# Patient Record
Sex: Female | Born: 1975
Health system: Southern US, Community
[De-identification: ages and names within clinical notes are randomized; demographics above are authoritative.]

## PROBLEM LIST (undated history)

## (undated) DIAGNOSIS — I10 Essential (primary) hypertension: Secondary | ICD-10-CM

## (undated) DIAGNOSIS — S32000A Wedge compression fracture of unspecified lumbar vertebra, initial encounter for closed fracture: Secondary | ICD-10-CM

## (undated) HISTORY — PX: BACK SURGERY: SHX140

---

## 1999-04-10 ENCOUNTER — Ambulatory Visit (HOSPITAL_COMMUNITY): Admission: RE | Admit: 1999-04-10 | Discharge: 1999-04-10 | Payer: Self-pay | Admitting: Obstetrics and Gynecology

## 1999-04-10 ENCOUNTER — Encounter: Payer: Self-pay | Admitting: Obstetrics and Gynecology

## 1999-04-27 ENCOUNTER — Encounter: Payer: Self-pay | Admitting: Obstetrics and Gynecology

## 1999-04-27 ENCOUNTER — Ambulatory Visit (HOSPITAL_COMMUNITY): Admission: RE | Admit: 1999-04-27 | Discharge: 1999-04-27 | Payer: Self-pay | Admitting: Obstetrics and Gynecology

## 1999-09-08 ENCOUNTER — Encounter (HOSPITAL_COMMUNITY): Admission: RE | Admit: 1999-09-08 | Discharge: 1999-09-22 | Payer: Self-pay | Admitting: Obstetrics and Gynecology

## 1999-09-21 ENCOUNTER — Inpatient Hospital Stay (HOSPITAL_COMMUNITY): Admission: AD | Admit: 1999-09-21 | Discharge: 1999-09-23 | Payer: Self-pay | Admitting: Obstetrics and Gynecology

## 2001-05-31 ENCOUNTER — Other Ambulatory Visit: Admission: RE | Admit: 2001-05-31 | Discharge: 2001-05-31 | Payer: Self-pay | Admitting: Obstetrics and Gynecology

## 2001-07-15 ENCOUNTER — Encounter: Payer: Self-pay | Admitting: Family Medicine

## 2001-07-15 ENCOUNTER — Ambulatory Visit (HOSPITAL_COMMUNITY): Admission: RE | Admit: 2001-07-15 | Discharge: 2001-07-15 | Payer: Self-pay | Admitting: Family Medicine

## 2002-06-18 ENCOUNTER — Other Ambulatory Visit: Admission: RE | Admit: 2002-06-18 | Discharge: 2002-06-18 | Payer: Self-pay | Admitting: Obstetrics and Gynecology

## 2003-07-01 ENCOUNTER — Emergency Department (HOSPITAL_COMMUNITY): Admission: EM | Admit: 2003-07-01 | Discharge: 2003-07-01 | Payer: Self-pay | Admitting: Emergency Medicine

## 2003-09-09 ENCOUNTER — Other Ambulatory Visit: Admission: RE | Admit: 2003-09-09 | Discharge: 2003-09-09 | Payer: Self-pay | Admitting: Obstetrics and Gynecology

## 2003-10-02 ENCOUNTER — Emergency Department (HOSPITAL_COMMUNITY): Admission: EM | Admit: 2003-10-02 | Discharge: 2003-10-02 | Payer: Self-pay | Admitting: Emergency Medicine

## 2005-06-22 ENCOUNTER — Other Ambulatory Visit: Admission: RE | Admit: 2005-06-22 | Discharge: 2005-06-22 | Payer: Self-pay | Admitting: Obstetrics and Gynecology

## 2006-09-02 ENCOUNTER — Ambulatory Visit: Payer: Self-pay | Admitting: Internal Medicine

## 2006-10-12 ENCOUNTER — Ambulatory Visit: Payer: Self-pay | Admitting: Internal Medicine

## 2007-10-27 ENCOUNTER — Ambulatory Visit: Payer: Self-pay | Admitting: Internal Medicine

## 2007-10-27 DIAGNOSIS — H6690 Otitis media, unspecified, unspecified ear: Secondary | ICD-10-CM | POA: Insufficient documentation

## 2007-10-27 DIAGNOSIS — I10 Essential (primary) hypertension: Secondary | ICD-10-CM

## 2008-05-31 ENCOUNTER — Telehealth: Payer: Self-pay | Admitting: Internal Medicine

## 2009-05-29 ENCOUNTER — Encounter: Payer: Self-pay | Admitting: Internal Medicine

## 2009-06-02 ENCOUNTER — Encounter: Admission: RE | Admit: 2009-06-02 | Discharge: 2009-06-02 | Payer: Self-pay | Admitting: Neurological Surgery

## 2009-08-01 ENCOUNTER — Encounter: Payer: Self-pay | Admitting: Internal Medicine

## 2010-03-02 ENCOUNTER — Ambulatory Visit: Payer: Self-pay | Admitting: Internal Medicine

## 2010-03-26 ENCOUNTER — Telehealth: Payer: Self-pay | Admitting: Internal Medicine

## 2010-06-21 LAB — GC/CHLAMYDIA PROBE AMP, GENITAL
Chlamydia: NEGATIVE
Gonorrhea: NEGATIVE

## 2010-06-21 LAB — HIV ANTIBODY (ROUTINE TESTING W REFLEX): HIV: NONREACTIVE

## 2010-06-21 LAB — TYPE AND SCREEN: Antibody Screen: NEGATIVE

## 2010-06-21 LAB — RPR: RPR: NONREACTIVE

## 2010-06-21 LAB — ABO/RH: RH Type: POSITIVE

## 2010-08-02 ENCOUNTER — Encounter: Payer: Self-pay | Admitting: Neurological Surgery

## 2010-08-13 NOTE — Letter (Signed)
Summary: [facility]  [facility]   Imported By: Maryln Gottron 08/12/2009 11:13:03  _____________________________________________________________________  External Attachment:    Type:   Image     Comment:   External Document

## 2010-08-13 NOTE — Assessment & Plan Note (Signed)
Summary: fu on meds/njr   Vital Signs:  Patient profile:   35 year old female Weight:      147 pounds Temp:     99.1 degrees F oral BP sitting:   150 / 90  (left arm) Cuff size:   regular  Vitals Entered By: Kathrynn Speed CMA (March 02, 2010 1:12 PM) CC: fu on med, has not taken Ramipril for 2 days,src   CC:  fu on med, has not taken Ramipril for 2 days, and src.  History of Present Illness: 35 year old patient who is seen today for follow-up of her hypertension.  Her medication was refilled her in a month as she has not taken this for two days.  She feels well.  No new concerns or complaints.  She has been on Altace 5 mg daily.  She states that when she tracks her blood pressure readings that usually in the 140/90 range.  She has tolerated her medications well and denies any coughing.  Compliance issues were addressed.  She states at the present time, he is attempting to become pregnant  Current Medications (verified): 1)  Ramipril 5 Mg  Caps (Ramipril) .Marland Kitchen.. 1 Once Daily  Allergies (verified): No Known Drug Allergies  Past History:  Past Surgical History: lamenectomy 11-2009  Review of Systems  The patient denies anorexia, fever, weight loss, weight gain, vision loss, decreased hearing, hoarseness, chest pain, syncope, dyspnea on exertion, peripheral edema, prolonged cough, headaches, hemoptysis, abdominal pain, melena, hematochezia, severe indigestion/heartburn, hematuria, incontinence, genital sores, muscle weakness, suspicious skin lesions, transient blindness, difficulty walking, depression, unusual weight change, abnormal bleeding, enlarged lymph nodes, angioedema, and breast masses.    Physical Exam  General:  overweight-appearing.  150/94 Head:  Normocephalic and atraumatic without obvious abnormalities. No apparent alopecia or balding. Eyes:  No corneal or conjunctival inflammation noted. EOMI. Perrla. Funduscopic exam benign, without hemorrhages, exudates or  papilledema. Vision grossly normal. Mouth:  Oral mucosa and oropharynx without lesions or exudates.  Teeth in good repair. Neck:  No deformities, masses, or tenderness noted. Lungs:  Normal respiratory effort, chest expands symmetrically. Lungs are clear to auscultation, no crackles or wheezes. Heart:  Normal rate and regular rhythm. S1 and S2 normal without gallop, murmur, click, rub or other extra sounds. Abdomen:  Bowel sounds positive,abdomen soft and non-tender without masses, organomegaly or hernias noted. Msk:  No deformity or scoliosis noted of thoracic or lumbar spine.     Impression & Recommendations:  Problem # 1:  HYPERTENSION (ICD-401.9)  The following medications were removed from the medication list:    Ramipril 5 Mg Caps (Ramipril) .Marland Kitchen... 1 once daily Her updated medication list for this problem includes:    Bisoprolol-hydrochlorothiazide 5-6.25 Mg Tabs (Bisoprolol-hydrochlorothiazide) ..... One daily  The following medications were removed from the medication list:    Ramipril 5 Mg Caps (Ramipril) .Marland Kitchen... 1 once daily Her updated medication list for this problem includes:    Lisinopril-hydrochlorothiazide 20-12.5 Mg Tabs (Lisinopril-hydrochlorothiazide) ..... One daily  Complete Medication List: 1)  Bisoprolol-hydrochlorothiazide 5-6.25 Mg Tabs (Bisoprolol-hydrochlorothiazide) .... One daily  Patient Instructions: 1)  discontinue ramipril  2)  Limit your Sodium (Salt). 3)  It is important that you exercise regularly at least 20 minutes 5 times a week. If you develop chest pain, have severe difficulty breathing, or feel very tired , stop exercising immediately and seek medical attention. 4)  Check your Blood Pressure regularly. If it is above: 140/90 you should make an appointment. 5)  Please schedule a follow-up appointment  in 3 months. Prescriptions: BISOPROLOL-HYDROCHLOROTHIAZIDE 5-6.25 MG TABS (BISOPROLOL-HYDROCHLOROTHIAZIDE) one daily  #90 x 3   Entered and  Authorized by:   Gordy Savers  MD   Signed by:   Gordy Savers  MD on 03/02/2010   Method used:   Print then Give to Patient   RxID:   [id number]

## 2010-08-13 NOTE — Letter (Signed)
Summary: [facility]  [facility]   Imported By: Maryln Gottron 07/23/2009 10:53:38  _____________________________________________________________________  External Attachment:    Type:   Image     Comment:   External Document

## 2010-08-13 NOTE — Progress Notes (Signed)
Summary: change meds  Phone Note Call from Patient Call back at Home Phone [phone]   Caller: Patient Call For: Gordy Savers  MD Summary of Call: Pt is stating the Bisoprolol/hctz is making her chest feel tight and gives her irregular heart rate.  Wants to change to something else, please. Initial call taken by: Lynann Beaver CMA,  March 26, 2010 8:51 AM  Follow-up for Phone Call        atenolol 50 mg  #90 one daily Follow-up by: Gordy Savers  MD,  March 26, 2010 9:09 AM    New/Updated Medications: ATENOLOL 50 MG TABS (ATENOLOL) one by mouth daily Prescriptions: ATENOLOL 50 MG TABS (ATENOLOL) one by mouth daily  #90 x 0   Entered by:   Lynann Beaver CMA   Authorized by:   Gordy Savers  MD   Signed by:   Lynann Beaver CMA on 03/26/2010   Method used:   Electronically to        [facility]* (retail)       [address]       [place], [place]  [id number]       Ph: [phone]       Fax: [phone]   RxID:   [id number]  Pt notified.

## 2010-11-27 NOTE — Discharge Summary (Signed)
[facility]  Patient:    [name], [name]                    MRN: [id number] Adm. Date:  [id number] Disc. Date: [id number] Attending:  Oliver Pila                           Discharge Summary  DISCHARGE DIAGNOSES:          1. Postdates pregnancy at 42 weeks, delivered.                               2. Status post induction and normal spontaneous                                  vaginal delivery.  DISCHARGE MEDICATIONS:        1. Motrin 600 mg p.o. every six hours.                               2. Percocet one to two tablets p.o. every four to  six hours p.r.n.  FOLLOW-UP:                    The patient is to follow up in approximately six weeks for her routine postpartum visit.  HOSPITAL COURSE:              The patient is a 35 year old, gravida 1, para 0, ho is admitted at 2 weeks as determined by her LMP consistent with an 18-week sono for induction given postdates pregnancy.  Pregnancy had been uncomplicated except for remote history of HSV.  She had had no outbreaks in three years and she had had NSTs biweekly since 40 weeks for her postdates status.  On admission, she had no leakage of fluid, no vaginal bleeding, and good fetal movement.  PRENATAL LABORATORY DATA:     A positive, antibody negative, RPR nonreactive, rubella immune, hepatitis B surface antigen negative, HIV negative, GC negative, Chlamydia negative, and GBS negative.  PAST OBSTETRIC HISTORY:       Insignificant.  GYN HISTORY:                  She had a history of cryotherapy for an abnormal ap smear and history of HSV as stated.  No medical history.  No surgical history of.  ALLERGIES:                    No known drug allergies.  PHYSICAL EXAMINATION:         VITAL SIGNS: She was afebrile with stable vital signs.  Fetal heart rate was reactive with a baseline of 115 to 120.  PELVIC: Her cervix was 50% effaced, tight 1 cm, and -2 station.  She had assisted  rupture of membranes with clear fluid obtained and was begun on Pitocin augmentation.  The  patient progressed throughout the day and had a normal spontaneous vaginal delivery of a vigorous female infant over a first degree perineal laceration.  Apgars were 8 and 9.  Weight was 7 pounds 10 ounces.  The placenta delivered spontaneously and her first degree laceration was repaired with 2-0 Vicryl.  Estimated blood loss was 300 cc.  She had a mild postpartum  temperature to 100.4, however, this resolved  quickly after delivery and on postpartum day #1, she was doing well, afebrile, ith stable vital signs.  Her fundus was firm and nontender.  Therefore, she was discharged to home with follow-up as previously stated. DD:  09/23/99 TD:  09/23/99 Job: [id number]

## 2010-11-27 NOTE — Assessment & Plan Note (Signed)
[facility]                            [facility] OFFICE NOTE   NAME:[name], [name]                    MRN:          [id number]  DATE:09/02/2006                            DOB:          11/20/1975    A 35 year old female seen today to establish with our practice.  She has  noted to have high blood pressure per GYN.  She is on ramipril 2.5 mg  daily.   PAST MEDICAL HISTORY:  Unremarkable.  She is a gravida 1, para 1,  abortus 0.  She has recently been prescribed Chantix for smoking  cessation.   FAMILY HISTORY:  Largely unremarkable except for a strong family history  of hypertension.  Mother died of lung cancer.   EXAMINATION:  GENERAL:  Revealed a well-developed young lady in no acute  distress.  VITAL SIGNS:  Blood pressure was consistently 160/100.  HEENT:  Fundi, ear, nose and throat clear.  NECK:  No adenopathy or bruits.  CHEST:  Clear.  CARDIOVASCULAR:  Normal heart sounds, no murmurs.  ABDOMEN:  Benign.  No bruits appreciated.  EXTREMITIES:  Negative.   IMPRESSION:  Moderately severe hypertension.   DISPOSITION:  Hydrochlorothiazide 25 mg daily will be added to her  regimen.  Her ramipril will be increased to 5 mg daily.  She will return  in 1 month for followup.     Gordy Savers, MD  Electronically Signed    PFK/MedQ  DD: 09/02/2006  DT: 09/02/2006  Job #: [id number]

## 2011-01-27 ENCOUNTER — Encounter (HOSPITAL_COMMUNITY): Payer: Self-pay | Admitting: *Deleted

## 2011-01-27 ENCOUNTER — Inpatient Hospital Stay (HOSPITAL_COMMUNITY)
Admission: AD | Admit: 2011-01-27 | Discharge: 2011-01-29 | DRG: 372 | Disposition: A | Discharge status: Home / Self Care | Payer: BC Managed Care – PPO | Source: Ambulatory Visit | Attending: Obstetrics and Gynecology | Admitting: Obstetrics and Gynecology

## 2011-01-27 ENCOUNTER — Inpatient Hospital Stay (HOSPITAL_COMMUNITY): Admission: RE | Admit: 2011-01-27 | Payer: Self-pay | Source: Ambulatory Visit

## 2011-01-27 DIAGNOSIS — O4290 Premature rupture of membranes, unspecified as to length of time between rupture and onset of labor, unspecified weeks of gestation: Principal | ICD-10-CM | POA: Diagnosis present

## 2011-01-27 DIAGNOSIS — O1002 Pre-existing essential hypertension complicating childbirth: Secondary | ICD-10-CM | POA: Diagnosis present

## 2011-01-27 DIAGNOSIS — I10 Essential (primary) hypertension: Secondary | ICD-10-CM

## 2011-01-27 DIAGNOSIS — H6690 Otitis media, unspecified, unspecified ear: Secondary | ICD-10-CM

## 2011-01-27 HISTORY — DX: Essential (primary) hypertension: I10

## 2011-01-27 MED ORDER — LACTATED RINGERS IV SOLN
INTRAVENOUS | Status: DC
  Administered 2011-01-27: 21:00:00 via INTRAVENOUS

## 2011-01-27 MED ORDER — LACTATED RINGERS IV SOLN: 500.0000 mL | INTRAVENOUS | Status: DC | PRN

## 2011-01-27 MED ORDER — OXYTOCIN 20 UNITS IN LACTATED RINGERS INFUSION - SIMPLE
1.0000 m[IU]/min | INTRAVENOUS | Status: DC
  Administered 2011-01-27: 1 m[IU]/min via INTRAVENOUS
  Administered 2011-01-27: 5 m[IU]/min via INTRAVENOUS
  Filled 2011-01-27: qty 1000

## 2011-01-27 MED ORDER — CLINDAMYCIN PHOSPHATE 900 MG/50ML IV SOLN: 900.0000 mg | Freq: Three times a day (TID) | INTRAVENOUS | Status: DC

## 2011-01-27 MED ORDER — OXYCODONE-ACETAMINOPHEN 5-325 MG PO TABS: 2.0000 | ORAL_TABLET | ORAL | Status: DC | PRN

## 2011-01-27 MED ORDER — IBUPROFEN 600 MG PO TABS: 600.0000 mg | ORAL_TABLET | Freq: Four times a day (QID) | ORAL | Status: DC | PRN

## 2011-01-27 MED ORDER — OXYTOCIN 20 UNITS IN LACTATED RINGERS INFUSION - SIMPLE: 125.0000 mL/h | Freq: Once | INTRAVENOUS | Status: DC

## 2011-01-27 MED ORDER — PENICILLIN G POTASSIUM 5000000 UNITS IJ SOLR: 2.5000 10*6.[IU] | INTRAVENOUS | Status: DC

## 2011-01-27 MED ORDER — PENICILLIN G POTASSIUM 5000000 UNITS IJ SOLR: 5.0000 10*6.[IU] | Freq: Once | INTRAVENOUS | Status: DC

## 2011-01-27 MED ORDER — LIDOCAINE HCL (PF) 1 % IJ SOLN: 30.0000 mL | INTRAMUSCULAR | Status: DC | PRN

## 2011-01-27 MED ORDER — LIDOCAINE HCL (PF) 1 % IJ SOLN
30.0000 mL | INTRAMUSCULAR | Status: DC | PRN
  Filled 2011-01-27: qty 30

## 2011-01-27 MED ORDER — LACTATED RINGERS IV SOLN: INTRAVENOUS | Status: DC

## 2011-01-27 NOTE — Plan of Care (Signed)
Problem: Consults Goal: Birthing Suites Patient Information Press F2 to bring up selections list Outcome: Completed/Met Date Met:  01/27/11  Pt > [redacted] weeks EGA

## 2011-01-28 ENCOUNTER — Inpatient Hospital Stay (HOSPITAL_COMMUNITY): Payer: BC Managed Care – PPO | Admitting: Anesthesiology

## 2011-01-28 ENCOUNTER — Encounter (HOSPITAL_COMMUNITY): Payer: Self-pay | Admitting: Unknown Physician Specialty

## 2011-01-28 ENCOUNTER — Encounter (HOSPITAL_COMMUNITY): Payer: Self-pay | Admitting: Anesthesiology

## 2011-01-28 ENCOUNTER — Encounter (HOSPITAL_COMMUNITY): Payer: Self-pay

## 2011-01-28 ENCOUNTER — Other Ambulatory Visit: Payer: Self-pay | Admitting: Obstetrics and Gynecology

## 2011-01-28 MED ORDER — EPHEDRINE 5 MG/ML INJ: 10.0000 mg | INTRAVENOUS | Status: DC | PRN

## 2011-01-28 MED ORDER — SENNOSIDES-DOCUSATE SODIUM 8.6-50 MG PO TABS: 1.0000 | ORAL_TABLET | Freq: Every day | ORAL | Status: DC

## 2011-01-28 MED ORDER — SIMETHICONE 80 MG PO CHEW: 80.0000 mg | CHEWABLE_TABLET | ORAL | Status: DC | PRN

## 2011-01-28 MED ORDER — TETANUS-DIPHTH-ACELL PERTUSSIS 5-2.5-18.5 LF-MCG/0.5 IM SUSP
0.5000 mL | Freq: Once | INTRAMUSCULAR | Status: DC
  Filled 2011-01-28: qty 0.5

## 2011-01-28 MED ORDER — DIPHENHYDRAMINE HCL 25 MG PO CAPS: 25.0000 mg | ORAL_CAPSULE | Freq: Four times a day (QID) | ORAL | Status: DC | PRN

## 2011-01-28 MED ORDER — LACTATED RINGERS IV SOLN
500.0000 mL | Freq: Once | INTRAVENOUS | Status: AC
  Administered 2011-01-28: 1000 mL via INTRAVENOUS

## 2011-01-28 MED ORDER — ONDANSETRON HCL 4 MG/2ML IJ SOLN: 4.0000 mg | INTRAMUSCULAR | Status: DC | PRN

## 2011-01-28 MED ORDER — DIPHENHYDRAMINE HCL 50 MG/ML IJ SOLN: 12.5000 mg | INTRAMUSCULAR | Status: DC | PRN

## 2011-01-28 MED ORDER — MEASLES, MUMPS & RUBELLA VAC ~~LOC~~ INJ
0.5000 mL | INJECTION | Freq: Once | SUBCUTANEOUS | Status: DC
  Filled 2011-01-28: qty 0.5

## 2011-01-28 MED ORDER — EPHEDRINE 5 MG/ML INJ
INTRAVENOUS | Status: AC
  Filled 2011-01-28: qty 4

## 2011-01-28 MED ORDER — WITCH HAZEL-GLYCERIN EX PADS: MEDICATED_PAD | CUTANEOUS | Status: DC | PRN

## 2011-01-28 MED ORDER — FENTANYL 2.5 MCG/ML BUPIVACAINE 1/10 % EPIDURAL INFUSION (WH - ANES)
INTRAMUSCULAR | Status: AC
  Administered 2011-01-28: 14 mL/h via EPIDURAL
  Filled 2011-01-28: qty 60

## 2011-01-28 MED ORDER — OXYCODONE-ACETAMINOPHEN 5-325 MG PO TABS
1.0000 | ORAL_TABLET | ORAL | Status: DC | PRN
  Administered 2011-01-28: 2 via ORAL
  Administered 2011-01-28: 1 via ORAL
  Administered 2011-01-28 – 2011-01-29 (×3): 2 via ORAL
  Filled 2011-01-28 (×5): qty 2

## 2011-01-28 MED ORDER — LIDOCAINE HCL 1.5 % IJ SOLN
INTRAMUSCULAR | Status: DC | PRN
  Administered 2011-01-28 (×2): 4 mL

## 2011-01-28 MED ORDER — PHENYLEPHRINE 40 MCG/ML (10ML) SYRINGE FOR IV PUSH (FOR BLOOD PRESSURE SUPPORT): 80.0000 ug | PREFILLED_SYRINGE | INTRAVENOUS | Status: DC | PRN

## 2011-01-28 MED ORDER — NALBUPHINE SYRINGE 5 MG/0.5 ML
INJECTION | INTRAMUSCULAR | Status: AC
  Administered 2011-01-28: 5 mg via INTRAVENOUS
  Filled 2011-01-28: qty 0.5

## 2011-01-28 MED ORDER — NALBUPHINE HCL 10 MG/ML IJ SOLN: 5.0000 mg | Freq: Four times a day (QID) | INTRAMUSCULAR | Status: DC | PRN

## 2011-01-28 MED ORDER — ONDANSETRON HCL 4 MG PO TABS: 4.0000 mg | ORAL_TABLET | ORAL | Status: DC | PRN

## 2011-01-28 MED ORDER — OXYCODONE-ACETAMINOPHEN 5-325 MG PO TABS: 1.0000 | ORAL_TABLET | ORAL | Status: DC | PRN

## 2011-01-28 MED ORDER — BENZOCAINE-MENTHOL 20-0.5 % EX AERO: 1.0000 "application " | INHALATION_SPRAY | CUTANEOUS | Status: DC | PRN

## 2011-01-28 MED ORDER — PRENATAL PLUS 27-1 MG PO TABS
1.0000 | ORAL_TABLET | Freq: Every day | ORAL | Status: DC
  Administered 2011-01-28 – 2011-01-29 (×2): 1 via ORAL
  Filled 2011-01-28 (×2): qty 1

## 2011-01-28 MED ORDER — ZOLPIDEM TARTRATE 5 MG PO TABS: 5.0000 mg | ORAL_TABLET | Freq: Every evening | ORAL | Status: DC | PRN

## 2011-01-28 MED ORDER — IBUPROFEN 600 MG PO TABS
600.0000 mg | ORAL_TABLET | Freq: Four times a day (QID) | ORAL | Status: DC
  Administered 2011-01-28 – 2011-01-29 (×4): 600 mg via ORAL
  Filled 2011-01-28 (×4): qty 1

## 2011-01-28 MED ORDER — OXYTOCIN 20 UNITS IN LACTATED RINGERS INFUSION - SIMPLE: 125.0000 mL/h | INTRAVENOUS | Status: AC

## 2011-01-28 MED ORDER — BENZOCAINE-MENTHOL 20-0.5 % EX AERO
INHALATION_SPRAY | CUTANEOUS | Status: AC
  Filled 2011-01-28: qty 56

## 2011-01-28 MED ORDER — LANOLIN HYDROUS EX OINT: TOPICAL_OINTMENT | CUTANEOUS | Status: DC | PRN

## 2011-01-28 MED ORDER — FENTANYL 2.5 MCG/ML BUPIVACAINE 1/10 % EPIDURAL INFUSION (WH - ANES)
14.0000 mL/h | INTRAMUSCULAR | Status: DC
  Administered 2011-01-28: 14 mL/h via EPIDURAL
  Filled 2011-01-28: qty 60

## 2011-01-28 MED ORDER — PHENYLEPHRINE 40 MCG/ML (10ML) SYRINGE FOR IV PUSH (FOR BLOOD PRESSURE SUPPORT)
PREFILLED_SYRINGE | INTRAVENOUS | Status: AC
  Filled 2011-01-28: qty 5

## 2011-01-28 MED ORDER — IBUPROFEN 600 MG PO TABS: 600.0000 mg | ORAL_TABLET | Freq: Four times a day (QID) | ORAL | Status: DC

## 2011-01-28 MED ORDER — PRENATAL PLUS 27-1 MG PO TABS
1.0000 | ORAL_TABLET | Freq: Every day | ORAL | Status: DC
  Administered 2011-01-28: 1 via ORAL

## 2011-01-28 NOTE — Anesthesia Procedure Notes (Signed)
Epidural Patient location during procedure: OB Start time: 01/28/2011 2:21 AM  Staffing Anesthesiologist: Candie Gintz A. Performed by: anesthesiologist   Preanesthetic Checklist Completed: patient identified, site marked, surgical consent, pre-op evaluation, timeout performed, IV checked, risks and benefits discussed and monitors and equipment checked  Epidural Patient position: sitting Prep: site prepped and draped and DuraPrep Patient monitoring: continuous pulse ox and blood pressure Approach: midline Injection technique: LOR air  Needle:  Needle type: Tuohy  Needle gauge: 17 G Needle length: 9 cm Needle insertion depth: 5 cm Catheter type: closed end flexible Catheter size: 19 Gauge Catheter at skin depth: 10 cm Test dose: negative and 1.5% lidocaine  Assessment Sensory level: T8 Events: blood not aspirated, injection not painful, no injection resistance, negative IV test and no paresthesia  Additional Notes Patient is more comfortable after epidural dosed. Please see RN's note for documentation of vital signs and FHR which are stable.

## 2011-01-28 NOTE — Progress Notes (Signed)
  Day of delivery. Afebrile no complaints

## 2011-01-28 NOTE — Progress Notes (Signed)
  Pt received her epidural and about the same time noted a gush of fluid from her vagina that came as a result  of her rupturing a forebag that the nurse had noted on her prior exam. Subsequently, the FHR began to  exhibit bradycardia that was treated with O2, position change and stopping the pitocin. I tried to insert a scalp electrode but the cervix was posterior and the station was high and I was unsuccessful. However, after my exam ,the cervix seemed to have become more anterior and was 4 cm. Now, the pitocin has been started back at 1/2 the prior dose and her contractions are q 5 minutes and her variables are down to 70's and 80's but there is good recovery at this time. I have alerted the OR that an emergency might occur.

## 2011-01-28 NOTE — Progress Notes (Signed)
  Delivery note:   She progressed rapidly to full dilatation, pushed well and delivered spontaneously ROA over a first degree perineal laceration a living female infant 6lbs. 9 oz with Apgars of 9at 1and 10 at 5 minutes. The DeLee and the bulb were used with the head on the perineum. The placenta was slow to separate but it delivered intact.The uterus was normal. There were 2 labial and a first degree perineal laceration that were hemostatic and not repaired. EBL 400 cc's.

## 2011-01-28 NOTE — H&P (Signed)
[name], [name] NO.:  [id number]  MEDICAL RECORD NO.:  [id number]  LOCATION:                                 FACILITY:  PHYSICIAN:  Malachi Pro. Ambrose Mantle, M.D. DATE OF BIRTH:  [birth year]  DATE OF ADMISSION:  01/27/2011 DATE OF DISCHARGE:                             HISTORY & PHYSICAL   HISTORY OF PRESENT ILLNESS:  This is a 35 year old white female, para 1- 0-0-1, gravida 2, EDC January 25, 2011, admitted with premature rupture of the membranes.  The patient states her membranes ruptured with a slight yellowish hue to the fluid at 6:30 a.m. on January 27, 2011.  She did not call our office in the late in the afternoon.  She was seen in the office, rupture of membranes was confirmed and she was sent to MAU for admission to the hospital.  She was kept in MAU for two and a half hours or so before a bed was available.  She is admitted now to begin on Pitocin for induction of labor.  She has had a few contractions, but nothing of any significance.  Blood group and type A+.  Negative antibody.  Pap smear normal.  Rubella immune.  RPR nonreactive. Hepatitis B surface antigen negative.  HIV negative.  GC and Chlamydia negative.  Cystic fibrosis screening negative.  First trimester screening negative.  Down syndrome risk 1 in 2900.  Trisomy 18 less than 1 in 10,000.  AFP was negative.  Ultrasound on June 22, 2010 showed an viable intrauterine pregnancy of 9 weeks and 1 day with an [facility] of January 24, 2011.  Another ultrasound on August 31, 2010 showed normal anatomy compatible with dates.  Her prenatal course was complicated by hypertension for which she took labetalol off and on and her last dosage was 25 mg a day every third or fourth day, by smoking and by chronic back pain for which she took Vicodin 10/325, probably 1 or 2 tablets per day.  The patient's group B strep culture was negative.  One-hour Glucola was 123.  Ultrasound on January 05, 2011 showed normal growth  of the fetus with growth in the 60th percentile, normal fluid volume.  Her blood pressures remains somewhat erratic, sometimes as high as 150/100 as low as 70/40.  PAST MEDICAL HISTORY:  Reveals no known allergies.  ILLNESSES:  Back injury, bulging disc, cervical dysplasia, cryosurgery in 1997, herpes genitalis, and hypertension.  OPERATIONS:  In 2011, she had a disk shaved from her lumbar spine.  MEDICATIONS:  Hydrocodone/acetaminophen 10/325 one or two a day. Labetalol 100 mg most recently one-quarter tablet every 3 or 4 days.  SOCIAL HISTORY:  The patient denies alcohol and illicit substance abuse. She does smoke.  FAMILY HISTORY:  Mother with a malignant brain tumor.  Father with a CVA and hypertension.  Mother with malignant lung cancer and paternal grandfather with myocardial infarction.  OBSTETRICAL HISTORY:  In September 22, 1999, the patient delivered a 7 pounds 10 ounces female vaginally at 42 weeks.  PHYSICAL EXAM:  VITAL SIGNS:  On admission, afebrile.  Blood pressure 139/96, pulse 93, respirations 18. HEART:  Normal size and sounds.  No murmurs. LUNGS:  Clear  to auscultation. ABDOMEN:  Soft.  Fundal height 36 cm.  Fetal heart tones normal.  Cervix with fingertip, 20% vertex at -3, slightly yellow stained fluid is present in the vagina and this is fern positive.  ADMITTING IMPRESSION:  Intrauterine pregnancy at 40+ weeks, premature rupture of the membranes, hypertension, chronic back pain, tobacco abuse.  The patient is admitted for intravenous Pitocin after rupture of membranes for 14-1/2 hours.     Malachi Pro. Ambrose Mantle, M.D.     TFH/MEDQ  D:  01/27/2011  T:  01/28/2011  Job:  [id number]

## 2011-01-28 NOTE — Progress Notes (Signed)
  Pt is on 7 mu/minute of pitocin and is having 10/10 pain. She states her pains have been severe for 1-2 hours. We have requested an epidural. FHT's are normal.

## 2011-01-28 NOTE — Anesthesia Preprocedure Evaluation (Signed)
Anesthesia Evaluation  Name, MR# and DOB Patient awake  General Assessment Comment  Reviewed: Allergy & Precautions, H&P  and Patient's Chart, lab work & pertinent test results  Airway Mallampati: III TM Distance: >3 FB Neck ROM: full    Dental No notable dental hx    Pulmonaryneg pulmonary ROS    clear to auscultation  pulmonary exam normal   Cardiovascular hypertension, On Medications    Neuro/PsychNegative Neurological ROS Negative Psych ROS  GI/Hepatic/Renal negative GI ROS, negative Liver ROS, and negative Renal ROS (+)       Endo/Other  Negative Endocrine ROS (+)   Abdominal   Musculoskeletal  Hematology negative hematology ROS (+)   Peds  Reproductive/Obstetrics (+) Pregnancy   Anesthesia Other Findings             Anesthesia Physical Anesthesia Plan  ASA: II  Anesthesia Plan: Epidural   Post-op Pain Management:    Induction:   Airway Management Planned:   Additional Equipment:   Intra-op Plan:   Post-operative Plan:   Informed Consent: I have reviewed the patients History and Physical, chart, labs and discussed the procedure including the risks, benefits and alternatives for the proposed anesthesia with the patient or authorized representative who has indicated his/her understanding and acceptance.     Plan Discussed with: Anesthesiologist  Anesthesia Plan Comments:         Anesthesia Quick Evaluation

## 2011-01-29 LAB — CBC
HCT: 33.1 % — ABNORMAL LOW (ref 36.0–46.0)
Hemoglobin: 11 g/dL — ABNORMAL LOW (ref 12.0–15.0)
MCHC: 33.2 g/dL (ref 30.0–36.0)
MCV: 91.7 fL (ref 78.0–100.0)
RDW: 14.2 % (ref 11.5–15.5)
WBC: 15.4 10*3/uL — ABNORMAL HIGH (ref 4.0–10.5)

## 2011-01-29 NOTE — Progress Notes (Signed)
#  1 afebrile BP acceptable Wants early discharge.

## 2011-01-29 NOTE — Progress Notes (Signed)
Charted on wrong pt.

## 2011-01-29 NOTE — Discharge Summary (Signed)
[name], [name] NO.:  [id number]  MEDICAL RECORD NO.:  [id number]  LOCATION:  9140                          FACILITY:  [facility]  PHYSICIAN:  Malachi Pro. Ambrose Mantle, M.D. DATE OF BIRTH:  09-16-1975  DATE OF ADMISSION:  01/27/2011 DATE OF DISCHARGE:                              DISCHARGE SUMMARY   HISTORY OF PRESENT ILLNESS:  This is a 35 year old white female para 1-0- 1, gravida 2 with [facility] January 25, 2011, admitted with premature rupture of the membranes.  Her labs showed blood group and type to be A+ with a negative antibody.  Pap smear was normal.  Rubella immune, RPR was nonreactive.  Hepatitis B surface antigen negative, HIV negative, GC and chlamydia negative, cystic fibrosis negative.  First trimester screen was negative.  Down syndrome risk 1 in 2900, trisomy 18 less than 1 and 10,000, AFP was negative.  The patient was admitted to the hospital and progressed in labor.  There were significant decelerations during the labor with necessitating stopping the Pitocin and restarting at a lower dose.  She did receive an epidural at 3:16 a.m. on January 28, 2011.  She noted a gush of fluid from her vagina and subsequently the fetal heart rate began to exhibit bradycardia that was treated with oxygen, position change, and stopping the Pitocin.  Subsequently, after we had resuscitated the baby, the patient did progress to full dilatation, pushed well, and delivered spontaneously ROA over first-degree perineal laceration, a living female infant 6 pounds 9 ounces with Apgars of 9 at one and 10 at five minutes.  Dr. Ambrose Mantle was in attendance.  Bulb were used with the head on the perineum.  There were three small lacerations that were hemostatic and not repaired.  Blood loss about 400 cc.  Preop hemoglobin was normal.  Postoperatively, the hemoglobin was 11, hematocrit 33.1, white count 15,400.  RPR was nonreactive.  FINAL DIAGNOSES:  Intrauterine pregnancy at term, delivered  ROA, premature rupture of the membranes.  OPERATION:  Spontaneous delivery ROA.  FINAL CONDITION:  Improved.  INSTRUCTIONS:  Include a regular discharge instruction booklet.  She will take Motrin over-the-counter and she has a prescription for Vicodin 10/325 that she will take as needed for pain and she has labetalol 100 mg and she takes 0.25% of a tablet on an as-needed basis.  She is to return to the office in 6 weeks for followup examination.     Malachi Pro. Ambrose Mantle, M.D.     TFH/MEDQ  D:  01/29/2011  T:  01/29/2011  Job:  [id number]

## 2011-02-11 ENCOUNTER — Encounter (HOSPITAL_COMMUNITY): Payer: Self-pay | Admitting: *Deleted

## 2011-02-19 ENCOUNTER — Other Ambulatory Visit: Payer: Self-pay | Admitting: *Deleted

## 2011-02-19 DIAGNOSIS — M545 Low back pain: Secondary | ICD-10-CM

## 2011-02-24 ENCOUNTER — Other Ambulatory Visit: Payer: BC Managed Care – PPO

## 2011-02-26 ENCOUNTER — Ambulatory Visit
Admission: RE | Admit: 2011-02-26 | Discharge: 2011-02-26 | Disposition: A | Discharge status: Home / Self Care | Payer: BC Managed Care – PPO | Source: Ambulatory Visit | Attending: *Deleted | Admitting: *Deleted

## 2011-02-26 DIAGNOSIS — M545 Low back pain: Secondary | ICD-10-CM

## 2011-02-26 MED ORDER — GADOBENATE DIMEGLUMINE 529 MG/ML IV SOLN
10.0000 mL | Freq: Once | INTRAVENOUS | Status: AC | PRN
  Administered 2011-02-26: 10 mL via INTRAVENOUS

## 2011-11-24 ENCOUNTER — Ambulatory Visit (INDEPENDENT_AMBULATORY_CARE_PROVIDER_SITE_OTHER): Payer: BC Managed Care – PPO | Admitting: Internal Medicine

## 2011-11-24 ENCOUNTER — Encounter: Payer: Self-pay | Admitting: Internal Medicine

## 2011-11-24 VITALS — BP 130/90 | Temp 99.0°F | Ht 62.5 in | Wt 123.0 lb

## 2011-11-24 DIAGNOSIS — I10 Essential (primary) hypertension: Secondary | ICD-10-CM

## 2011-11-24 MED ORDER — LORAZEPAM 0.5 MG PO TABS: 0.5000 mg | ORAL_TABLET | Freq: Two times a day (BID) | ORAL | Status: AC | PRN

## 2011-11-24 MED ORDER — RAMIPRIL 5 MG PO CAPS: 5.0000 mg | ORAL_CAPSULE | Freq: Every day | ORAL | Status: DC

## 2011-11-24 MED ORDER — ZOLPIDEM TARTRATE 10 MG PO TABS: 10.0000 mg | ORAL_TABLET | Freq: Every evening | ORAL | Status: DC | PRN

## 2011-11-24 NOTE — Patient Instructions (Signed)
Limit your sodium (Salt) intake    It is important that you exercise regularly, at least 20 minutes 3 to 4 times per week.  If you develop chest pain or shortness of breath seek  medical attention.  Behavioral Health referral as discussed

## 2011-11-25 ENCOUNTER — Encounter: Payer: Self-pay | Admitting: Internal Medicine

## 2011-11-25 NOTE — Progress Notes (Signed)
  Subjective:    Patient ID: [name], female    DOB: 1975-10-18, 36 y.o.   MRN: [id number]  HPI 36 year old female who has a history of gestational hypertension. 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 Presently she has down titrated Normodyne to 50 mg once daily she is maintained reasonable blood pressure control. She has 2 children and does not plan on any further pregnancies. She states that she did quite well on Altace prior to her pregnancy She has been under considerable situational stress and states that she has lost 30 pounds over the past 3 months. She has some marital challenges as well as illness in the family. She is sleeping poorly and quite anxious and stressed. She does monitor home blood pressure readings with normal blood pressures;  she is agreeable to counseling. She does have a history of excessive vaginal bleeding and is scheduled for GYN followup tomorrow   Review of Systems  Constitutional: Negative.   HENT: Negative for hearing loss, congestion, sore throat, rhinorrhea, dental problem, sinus pressure and tinnitus.   Eyes: Negative for pain, discharge and visual disturbance.  Respiratory: Negative for cough and shortness of breath.   Cardiovascular: Negative for chest pain, palpitations and leg swelling.  Gastrointestinal: Negative for nausea, vomiting, abdominal pain, diarrhea, constipation, blood in stool and abdominal distention.  Genitourinary: Positive for menstrual problem. Negative for dysuria, urgency, frequency, hematuria, flank pain, vaginal bleeding, vaginal discharge, difficulty urinating, vaginal pain and pelvic pain.  Musculoskeletal: Negative for joint swelling, arthralgias and gait problem.  Skin: Negative for rash.  Neurological: Negative for dizziness, syncope, speech difficulty, weakness, numbness and headaches.  Hematological: Negative for adenopathy.  Psychiatric/Behavioral: Positive for  sleep disturbance. Negative for behavioral problems, dysphoric mood and agitation. The patient is nervous/anxious.        Objective:   Physical Exam  Constitutional: She is oriented to person, place, and time. She appears well-developed and well-nourished.       Blood pressure 130/84  HENT:  Head: Normocephalic.  Right Ear: External ear normal.  Left Ear: External ear normal.  Mouth/Throat: Oropharynx is clear and moist.  Eyes: Conjunctivae and EOM are normal. Pupils are equal, round, and reactive to light.  Neck: Normal range of motion. Neck supple. No thyromegaly present.  Cardiovascular: Normal rate, regular rhythm, normal heart sounds and intact distal pulses.   Pulmonary/Chest: Effort normal and breath sounds normal.  Abdominal: Soft. Bowel sounds are normal. She exhibits no mass. There is no tenderness.  Musculoskeletal: Normal range of motion.  Lymphadenopathy:    She has no cervical adenopathy.  Neurological: She is alert and oriented to person, place, and time.  Skin: Skin is warm and dry. No rash noted.  Psychiatric: Her behavior is normal.       Anxious tearful          Assessment & Plan:   History of gestational hypertension. Normotensive at present on low-dose blood pressure medication. Options were discussed we'll give a prescription for Altace 5 mg. We'll challenge off medication at the present time in view of her 3 month history of significant weight loss. If blood pressures consistently over 135/90 will resume medication Situational anxiety stress and insomnia. We'll place on short-term anxiolytics as well as Ambien to assist with sleep. Behavioral health referral encouraged. Will recheck here in one month and we'll consider SSRI at that time

## 2011-12-24 ENCOUNTER — Ambulatory Visit (INDEPENDENT_AMBULATORY_CARE_PROVIDER_SITE_OTHER): Payer: BC Managed Care – PPO | Admitting: Internal Medicine

## 2011-12-24 ENCOUNTER — Encounter: Payer: Self-pay | Admitting: Internal Medicine

## 2011-12-24 VITALS — BP 110/70 | Temp 98.1°F | Wt 123.0 lb

## 2011-12-24 DIAGNOSIS — I10 Essential (primary) hypertension: Secondary | ICD-10-CM

## 2011-12-24 NOTE — Progress Notes (Signed)
  Subjective:    Patient ID: [name], female    DOB: [birth year], 36 y.o.   MRN: [id number]  HPI  36 year old patient who is seen today for followup. She has a history of gestational hypertension and was seen here one month ago with blood pressure concerns. At that time she had considerable situational stress and was sleeping quite poorly. She was quite anxious with severe insomnia and sleep deprivation. Behavioral health referral discussed. Today she is greatly improved and does not require anxiolytics at this time. She has been monitoring blood pressures with normal results. Initial blood pressure here low normal. She feels quite well and her situational stress has moderated greatly. She was given a prescription of Altace which she took prior to her pregnancies to take only if blood pressure became elevated. She has not required any blood pressure medication. She is quite pleased with her present progress    Review of Systems  Constitutional: Negative.   HENT: Negative for hearing loss, congestion, sore throat, rhinorrhea, dental problem, sinus pressure and tinnitus.   Eyes: Negative for pain, discharge and visual disturbance.  Respiratory: Negative for cough and shortness of breath.   Cardiovascular: Negative for chest pain, palpitations and leg swelling.  Gastrointestinal: Negative for nausea, vomiting, abdominal pain, diarrhea, constipation, blood in stool and abdominal distention.  Genitourinary: Negative for dysuria, urgency, frequency, hematuria, flank pain, vaginal bleeding, vaginal discharge, difficulty urinating, vaginal pain and pelvic pain.  Musculoskeletal: Negative for joint swelling, arthralgias and gait problem.  Skin: Negative for rash.  Neurological: Negative for dizziness, syncope, speech difficulty, weakness, numbness and headaches.  Hematological: Negative for adenopathy.  Psychiatric/Behavioral: Positive for disturbed wake/sleep cycle. Negative for behavioral  problems, dysphoric mood and agitation. The patient is not nervous/anxious.        Objective:   Physical Exam  Constitutional: She appears well-developed and well-nourished. No distress.       Very upbeat pleasant affect Does not appear to have any anxiety Initial blood pressure 110/70 Later blood pressure 130/80          Assessment & Plan:   Normotensive Situational stress. Much improved Insomnia. Much improved  She'll consider behavioral health referral if needed in the future. We'll maintain off blood pressure medications and continue home blood pressure monitor. Return here when necessary

## 2011-12-24 NOTE — Patient Instructions (Signed)
Limit your sodium (Salt) intake  Please check your blood pressure on a regular basis.  If it is consistently greater than 150/90, please make an office appointment.     It is important that you exercise regularly, at least 20 minutes 3 to 4 times per week.  If you develop chest pain or shortness of breath seek  medical attention. 

## 2012-03-23 ENCOUNTER — Ambulatory Visit (INDEPENDENT_AMBULATORY_CARE_PROVIDER_SITE_OTHER): Payer: BC Managed Care – PPO | Admitting: Licensed Clinical Social Worker

## 2012-03-23 DIAGNOSIS — F449 Dissociative and conversion disorder, unspecified: Secondary | ICD-10-CM

## 2012-03-23 DIAGNOSIS — F331 Major depressive disorder, recurrent, moderate: Secondary | ICD-10-CM

## 2012-03-29 ENCOUNTER — Ambulatory Visit: Payer: BC Managed Care – PPO | Admitting: Licensed Clinical Social Worker

## 2012-07-12 HISTORY — PX: BACK SURGERY: SHX140

## 2012-07-14 ENCOUNTER — Telehealth: Payer: Self-pay | Admitting: Internal Medicine

## 2012-07-14 NOTE — Telephone Encounter (Signed)
[facility] Triage Call Report Triage Record Num: [id number] Operator: Alphonsa Overall Patient Name: [name] Call Date & Time: 07/13/2012 6:37:05PM Patient Phone: PCP: Gordy Savers Patient Gender: Female PCP Fax : [phone] Patient DOB: 1976-07-06 Practice Name: [facility] Reason for Call: LMP 07/05/12. [name] calling about blood pressure. Ongoing. Pt off medication for BP x months. No recent record in EPIC. 197/154 blood pressure at 1330. Left fingers x 2 with numbness. Lightheadedness. Chest discomfort at times. Systolic blood pressure and diastolic blood pressure elevation. Care advice given per HTN protocol. Pt advised to seek care tonight, pt declines will call office in am. Protocol(s) Used: Hypertension, Diagnosed or Suspected Recommended Outcome per Protocol: See Provider within 4 hours Reason for Outcome: Systolic blood pressure of more than 180 mmHg OR diastolic blood pressure of more than 120 mmHg Care Advice: ~ Another adult should drive. Call EMS 911 if new symptoms develop, such as severe shortness of breath, chest pain, change in mental status, acute neurologic deficit, seizure, visual disturbances, pulse rate > 120 / minute, or very irregular pulse. ~ ~ Call provider if symptoms worsen or new symptoms develop. ~ HEALTH PROMOTION / MAINTENANCE ~ List, or take, all current prescription(s), nonprescription or alternative medication(s) to provider for evaluation. Medication Advice: - Discontinue all nonprescription and alternative medications, especially stimulants, until evaluated by provider. - Take prescribed medications as directed, following label instructions for the medication. - Do not change medications or dosing regimen until provider is consulted. - Know possible side effects of medication and what to do if they occur. - Tell provider all prescription, nonprescription or alternative medications that you take ~ 01/02/

## 2012-07-21 ENCOUNTER — Other Ambulatory Visit: Payer: Self-pay | Admitting: Anesthesiology

## 2012-07-21 DIAGNOSIS — M545 Low back pain: Secondary | ICD-10-CM

## 2012-07-27 ENCOUNTER — Other Ambulatory Visit: Payer: BC Managed Care – PPO

## 2012-07-28 ENCOUNTER — Ambulatory Visit
Admission: RE | Admit: 2012-07-28 | Discharge: 2012-07-28 | Disposition: A | Discharge status: Home / Self Care | Payer: BC Managed Care – PPO | Source: Ambulatory Visit | Attending: Anesthesiology | Admitting: Anesthesiology

## 2012-07-28 DIAGNOSIS — M545 Low back pain: Secondary | ICD-10-CM

## 2012-07-28 MED ORDER — GADOBENATE DIMEGLUMINE 529 MG/ML IV SOLN
10.0000 mL | Freq: Once | INTRAVENOUS | Status: AC | PRN
  Administered 2012-07-28: 10 mL via INTRAVENOUS

## 2012-08-04 ENCOUNTER — Encounter (HOSPITAL_COMMUNITY): Payer: Self-pay | Admitting: Emergency Medicine

## 2012-08-04 ENCOUNTER — Emergency Department (HOSPITAL_COMMUNITY)
Admission: EM | Admit: 2012-08-04 | Discharge: 2012-08-04 | Disposition: A | Discharge status: Home / Self Care | Payer: BC Managed Care – PPO | Attending: Emergency Medicine | Admitting: Emergency Medicine

## 2012-08-04 DIAGNOSIS — F17200 Nicotine dependence, unspecified, uncomplicated: Secondary | ICD-10-CM | POA: Insufficient documentation

## 2012-08-04 DIAGNOSIS — M545 Low back pain, unspecified: Secondary | ICD-10-CM | POA: Insufficient documentation

## 2012-08-04 DIAGNOSIS — I10 Essential (primary) hypertension: Secondary | ICD-10-CM | POA: Insufficient documentation

## 2012-08-04 DIAGNOSIS — Z8781 Personal history of (healed) traumatic fracture: Secondary | ICD-10-CM | POA: Insufficient documentation

## 2012-08-04 DIAGNOSIS — G8929 Other chronic pain: Secondary | ICD-10-CM | POA: Insufficient documentation

## 2012-08-04 DIAGNOSIS — R52 Pain, unspecified: Secondary | ICD-10-CM | POA: Insufficient documentation

## 2012-08-04 DIAGNOSIS — Z9889 Other specified postprocedural states: Secondary | ICD-10-CM | POA: Insufficient documentation

## 2012-08-04 DIAGNOSIS — M549 Dorsalgia, unspecified: Secondary | ICD-10-CM

## 2012-08-04 HISTORY — DX: Wedge compression fracture of unspecified lumbar vertebra, initial encounter for closed fracture: S32.000A

## 2012-08-04 MED ORDER — HYDROMORPHONE HCL PF 1 MG/ML IJ SOLN
1.0000 mg | Freq: Once | INTRAMUSCULAR | Status: AC
  Administered 2012-08-04: 1 mg via INTRAVENOUS
  Filled 2012-08-04: qty 1

## 2012-08-04 MED ORDER — HYDROMORPHONE HCL 4 MG PO TABS: 4.0000 mg | ORAL_TABLET | ORAL | Status: DC | PRN

## 2012-08-04 MED ORDER — DEXAMETHASONE SODIUM PHOSPHATE 10 MG/ML IJ SOLN
10.0000 mg | Freq: Once | INTRAMUSCULAR | Status: AC
  Administered 2012-08-04: 10 mg via INTRAVENOUS
  Filled 2012-08-04: qty 1

## 2012-08-04 MED ORDER — KETOROLAC TROMETHAMINE 30 MG/ML IJ SOLN
30.0000 mg | Freq: Once | INTRAMUSCULAR | Status: AC
  Administered 2012-08-04: 30 mg via INTRAVENOUS
  Filled 2012-08-04: qty 1

## 2012-08-04 NOTE — ED Provider Notes (Signed)
History     CSN: [id number]  Arrival date & time 08/04/12  1041   First MD Initiated Contact with Patient 08/04/12 1121      Chief Complaint  Patient presents with  . Back Pain    (Consider location/radiation/quality/duration/timing/severity/associated sxs/prior treatment) Patient is a 37 y.o. female presenting with back pain. The history is provided by the patient.  Back Pain  This is a chronic problem. Pertinent negatives include no chest pain, no numbness, no headaches, no abdominal pain and no weakness.   acute on chronic back pain. His recent MRI and is reportedly going to get surgery by [place]. Pain unrelieved by her medicines at home. It starts in the lower back down to her left lower extremity no swelling no leg. No weakness. She states the pain are better she think she be able his leg. She's been recently switched from hydrocodone 10 mg to oxycodone 10 mg. No relief in the pain. His been worse over the last week. No loss of bladder or bowel control. No fevers. No abdominal pain.  Past Medical History  Diagnosis Date  . Hypertension   . Compression fracture of lumbar vertebra     Past Surgical History  Procedure Date  . Back surgery     No family history on file.  History  Substance Use Topics  . Smoking status: Current Every Day Smoker -- 0.2 packs/day for 15 years    Types: Cigarettes  . Smokeless tobacco: Never Used  . Alcohol Use: Yes     Comment: rarely    OB History    Grav Para Term Preterm Abortions TAB SAB Ect Mult Living   2 2 2  0 0 0 0 0 0 2      Review of Systems  Constitutional: Negative for activity change and appetite change.  HENT: Negative for neck stiffness.   Eyes: Negative for pain.  Respiratory: Negative for chest tightness and shortness of breath.   Cardiovascular: Negative for chest pain and leg swelling.  Gastrointestinal: Negative for nausea, vomiting, abdominal pain and diarrhea.  Genitourinary: Negative for flank pain.    Musculoskeletal: Positive for back pain.  Skin: Negative for rash.  Neurological: Negative for weakness, numbness and headaches.  Psychiatric/Behavioral: Negative for behavioral problems.    Allergies  Review of patient's allergies indicates no known allergies.  Home Medications   Current Outpatient Rx  Name  Route  Sig  Dispense  Refill  . DULOXETINE HCL 60 MG PO CPEP   Oral   Take 60 mg by mouth daily.         . ADULT MULTIVITAMIN W/MINERALS CH   Oral   Take 1 tablet by mouth daily.         . NORETHINDRONE ACET-ETHINYL EST 1.5-30 MG-MCG PO TABS   Oral   Take 1 tablet by mouth daily.         . OXYCODONE-ACETAMINOPHEN 10-325 MG PO TABS   Oral   Take 1 tablet by mouth every 4 (four) hours as needed. For pain         . RAMIPRIL 5 MG PO CAPS   Oral   Take 1 capsule (5 mg total) by mouth daily.   90 capsule   3   . HYDROMORPHONE HCL 4 MG PO TABS   Oral   Take 1 tablet (4 mg total) by mouth every 4 (four) hours as needed for pain.   10 tablet   0     BP 121/80  Pulse  79  Temp 98.8 F (37.1 C) (Oral)  Resp 16  Wt 115 lb (52.164 kg)  SpO2 97%  LMP 07/31/2012  Physical Exam  Nursing note and vitals reviewed. Constitutional: She is oriented to person, place, and time. She appears well-developed and well-nourished.  HENT:  Head: Normocephalic and atraumatic.  Eyes: EOM are normal. Pupils are equal, round, and reactive to light.  Neck: Normal range of motion. Neck supple.  Cardiovascular: Normal rate, regular rhythm and normal heart sounds.   No murmur heard. Pulmonary/Chest: Effort normal and breath sounds normal. No respiratory distress. She has no wheezes. She has no rales.  Abdominal: Soft. Bowel sounds are normal. She exhibits no distension. There is no tenderness. There is no rebound and no guarding.  Musculoskeletal: Normal range of motion.       Tenderness in the left lower back and lumbar area. Pain with movement of left lower extremity. Pulse  intact to her foot. Sensation intact over her foot. Peroneal sensation intact  Neurological: She is alert and oriented to person, place, and time. No cranial nerve deficit.  Skin: Skin is warm and dry.  Psychiatric: She has a normal mood and affect. Her speech is normal.    ED Course  Procedures (including critical care time)  Labs Reviewed - No data to display No results found.   1. Back pain       MDM  Patient with acute on chronic back pain. Recent MRI shows bulging disc with nerve compression. Patient feels better after treatment. After discussion with Dr. Danielle Dess, Toradol and steroids were given. She'll follow up with him in the office        [place] R. Rubin Payor, MD 08/04/12 1520

## 2012-08-04 NOTE — ED Notes (Signed)
Low back pain, is supposed to have a fusion done by [facility], pt is crying with pain and states she just can't handle the pain. She states the paper work is in progress, they have not called to schedule surgery. MRI done last week.

## 2012-08-04 NOTE — ED Notes (Signed)
In to assess pt's pain, pt. Sleeping, woke pt. Up and pt. states pain is much better.  Rates pain a 7/10 and says "it's doing OK now I think I can eat"

## 2012-08-09 ENCOUNTER — Other Ambulatory Visit: Payer: Self-pay | Admitting: Neurological Surgery

## 2012-08-10 ENCOUNTER — Ambulatory Visit (HOSPITAL_COMMUNITY): Payer: BC Managed Care – PPO

## 2012-08-10 ENCOUNTER — Encounter (HOSPITAL_COMMUNITY): Payer: Self-pay | Admitting: Certified Registered"

## 2012-08-10 ENCOUNTER — Encounter (HOSPITAL_COMMUNITY): Payer: Self-pay | Admitting: General Practice

## 2012-08-10 ENCOUNTER — Encounter (HOSPITAL_COMMUNITY): Payer: Self-pay | Admitting: *Deleted

## 2012-08-10 ENCOUNTER — Encounter (HOSPITAL_COMMUNITY)
Admission: RE | Disposition: A | Discharge status: Home / Self Care | Payer: Self-pay | Source: Ambulatory Visit | Attending: Neurological Surgery

## 2012-08-10 ENCOUNTER — Ambulatory Visit (HOSPITAL_COMMUNITY): Payer: BC Managed Care – PPO | Admitting: Certified Registered"

## 2012-08-10 ENCOUNTER — Inpatient Hospital Stay (HOSPITAL_COMMUNITY)
Admission: RE | Admit: 2012-08-10 | Discharge: 2012-08-13 | DRG: 756 | Disposition: A | Discharge status: Home / Self Care | Payer: BC Managed Care – PPO | Source: Ambulatory Visit | Attending: Neurological Surgery | Admitting: Neurological Surgery

## 2012-08-10 DIAGNOSIS — F17200 Nicotine dependence, unspecified, uncomplicated: Secondary | ICD-10-CM | POA: Diagnosis present

## 2012-08-10 DIAGNOSIS — I10 Essential (primary) hypertension: Secondary | ICD-10-CM | POA: Diagnosis present

## 2012-08-10 DIAGNOSIS — Z79899 Other long term (current) drug therapy: Secondary | ICD-10-CM

## 2012-08-10 DIAGNOSIS — F411 Generalized anxiety disorder: Secondary | ICD-10-CM | POA: Diagnosis present

## 2012-08-10 DIAGNOSIS — M5126 Other intervertebral disc displacement, lumbar region: Principal | ICD-10-CM | POA: Diagnosis present

## 2012-08-10 DIAGNOSIS — M216X9 Other acquired deformities of unspecified foot: Secondary | ICD-10-CM | POA: Diagnosis present

## 2012-08-10 LAB — BASIC METABOLIC PANEL
BUN: 15 mg/dL (ref 6–23)
Chloride: 104 mEq/L (ref 96–112)
GFR calc Af Amer: >90 mL/min (ref >90)
GFR calc non Af Amer: >90 mL/min (ref >90)
Glucose, Bld: 87 mg/dL (ref 70–99)
Potassium: 3.9 mEq/L (ref 3.5–5.1)
Sodium: 138 mEq/L (ref 135–145)

## 2012-08-10 LAB — CBC
HCT: 41.2 % (ref 36.0–46.0)
Hemoglobin: 13.6 g/dL (ref 12.0–15.0)
MCHC: 33 g/dL (ref 30.0–36.0)
RBC: 4.38 MIL/uL (ref 3.87–5.11)

## 2012-08-10 LAB — SURGICAL PCR SCREEN
MRSA, PCR: NEGATIVE
Staphylococcus aureus: NEGATIVE

## 2012-08-10 SURGERY — POSTERIOR LUMBAR FUSION 1 LEVEL
Anesthesia: General | Site: Back | Wound class: Clean

## 2012-08-10 MED ORDER — LACTATED RINGERS IV SOLN
INTRAVENOUS | Status: DC | PRN
  Administered 2012-08-10 (×3): via INTRAVENOUS

## 2012-08-10 MED ORDER — OXYCODONE-ACETAMINOPHEN 5-325 MG PO TABS
1.0000 | ORAL_TABLET | Freq: Once | ORAL | Status: AC
  Administered 2012-08-10: 1 via ORAL
  Filled 2012-08-10: qty 1

## 2012-08-10 MED ORDER — ONDANSETRON HCL 4 MG/2ML IJ SOLN: 4.0000 mg | INTRAMUSCULAR | Status: DC | PRN

## 2012-08-10 MED ORDER — DULOXETINE HCL 60 MG PO CPEP
60.0000 mg | ORAL_CAPSULE | Freq: Every day | ORAL | Status: DC
  Administered 2012-08-11 – 2012-08-13 (×3): 60 mg via ORAL
  Filled 2012-08-10 (×3): qty 1

## 2012-08-10 MED ORDER — ONDANSETRON HCL 4 MG/2ML IJ SOLN
INTRAMUSCULAR | Status: DC | PRN
  Administered 2012-08-10: 4 mg via INTRAVENOUS

## 2012-08-10 MED ORDER — LIDOCAINE-EPINEPHRINE 1 %-1:100000 IJ SOLN
INTRAMUSCULAR | Status: DC | PRN
  Administered 2012-08-10: 10 mL

## 2012-08-10 MED ORDER — PHENYLEPHRINE HCL 10 MG/ML IJ SOLN
INTRAMUSCULAR | Status: DC | PRN
  Administered 2012-08-10: 80 ug via INTRAVENOUS
  Administered 2012-08-10: 40 ug via INTRAVENOUS
  Administered 2012-08-10: 80 ug via INTRAVENOUS

## 2012-08-10 MED ORDER — SODIUM CHLORIDE 0.9 % IJ SOLN: 3.0000 mL | INTRAMUSCULAR | Status: DC | PRN

## 2012-08-10 MED ORDER — OXYCODONE HCL 5 MG PO TABS: 5.0000 mg | ORAL_TABLET | Freq: Once | ORAL | Status: DC | PRN

## 2012-08-10 MED ORDER — SENNA 8.6 MG PO TABS
1.0000 | ORAL_TABLET | Freq: Two times a day (BID) | ORAL | Status: DC
  Administered 2012-08-11 – 2012-08-13 (×4): 8.6 mg via ORAL
  Filled 2012-08-10 (×6): qty 1

## 2012-08-10 MED ORDER — MENTHOL 3 MG MT LOZG: 1.0000 | LOZENGE | OROMUCOSAL | Status: DC | PRN

## 2012-08-10 MED ORDER — PHENOL 1.4 % MT LIQD: 1.0000 | OROMUCOSAL | Status: DC | PRN

## 2012-08-10 MED ORDER — HYDROMORPHONE HCL PF 1 MG/ML IJ SOLN
0.2500 mg | INTRAMUSCULAR | Status: DC | PRN
  Administered 2012-08-10 (×2): 0.5 mg via INTRAVENOUS

## 2012-08-10 MED ORDER — SODIUM CHLORIDE 0.9 % IV SOLN
INTRAVENOUS | Status: DC
  Administered 2012-08-11: 01:00:00 via INTRAVENOUS

## 2012-08-10 MED ORDER — SODIUM CHLORIDE 0.9 % IV SOLN
INTRAVENOUS | Status: AC
  Filled 2012-08-10: qty 500

## 2012-08-10 MED ORDER — NEOSTIGMINE METHYLSULFATE 1 MG/ML IJ SOLN
INTRAMUSCULAR | Status: DC | PRN
  Administered 2012-08-10: 3 mg via INTRAVENOUS

## 2012-08-10 MED ORDER — 0.9 % SODIUM CHLORIDE (POUR BTL) OPTIME
TOPICAL | Status: DC | PRN
  Administered 2012-08-10: 1000 mL

## 2012-08-10 MED ORDER — ACETAMINOPHEN 325 MG PO TABS: 650.0000 mg | ORAL_TABLET | ORAL | Status: DC | PRN

## 2012-08-10 MED ORDER — THROMBIN 20000 UNITS EX SOLR
CUTANEOUS | Status: DC | PRN
  Administered 2012-08-10: 20:00:00 via TOPICAL

## 2012-08-10 MED ORDER — FENTANYL CITRATE 0.05 MG/ML IJ SOLN
INTRAMUSCULAR | Status: DC | PRN
  Administered 2012-08-10: 150 ug via INTRAVENOUS
  Administered 2012-08-10 (×2): 50 ug via INTRAVENOUS
  Administered 2012-08-10: 100 ug via INTRAVENOUS

## 2012-08-10 MED ORDER — CEFAZOLIN SODIUM 1-5 GM-% IV SOLN
1.0000 g | Freq: Three times a day (TID) | INTRAVENOUS | Status: AC
  Administered 2012-08-11 (×2): 1 g via INTRAVENOUS
  Filled 2012-08-10 (×2): qty 50

## 2012-08-10 MED ORDER — CEFAZOLIN SODIUM-DEXTROSE 2-3 GM-% IV SOLR
INTRAVENOUS | Status: AC
  Administered 2012-08-10: 2 g via INTRAVENOUS
  Filled 2012-08-10: qty 50

## 2012-08-10 MED ORDER — DOCUSATE SODIUM 100 MG PO CAPS
100.0000 mg | ORAL_CAPSULE | Freq: Two times a day (BID) | ORAL | Status: DC
  Administered 2012-08-11 – 2012-08-12 (×4): 100 mg via ORAL
  Filled 2012-08-10 (×4): qty 1

## 2012-08-10 MED ORDER — MUPIROCIN 2 % EX OINT
TOPICAL_OINTMENT | CUTANEOUS | Status: AC
  Filled 2012-08-10: qty 22

## 2012-08-10 MED ORDER — PHENYLEPHRINE HCL 10 MG/ML IJ SOLN
10.0000 mg | INTRAVENOUS | Status: DC | PRN
  Administered 2012-08-10: 20 ug/min via INTRAVENOUS

## 2012-08-10 MED ORDER — ACETAMINOPHEN 650 MG RE SUPP: 650.0000 mg | RECTAL | Status: DC | PRN

## 2012-08-10 MED ORDER — SODIUM CHLORIDE 0.9 % IR SOLN
Status: DC | PRN
  Administered 2012-08-10: 20:00:00

## 2012-08-10 MED ORDER — HYDROMORPHONE HCL PF 1 MG/ML IJ SOLN
0.5000 mg | INTRAMUSCULAR | Status: DC | PRN
  Administered 2012-08-11 – 2012-08-13 (×6): 1 mg via INTRAVENOUS
  Filled 2012-08-10 (×6): qty 1

## 2012-08-10 MED ORDER — NICOTINE 14 MG/24HR TD PT24
14.0000 mg | MEDICATED_PATCH | Freq: Every day | TRANSDERMAL | Status: DC
  Administered 2012-08-12: 14 mg via TRANSDERMAL
  Filled 2012-08-10 (×3): qty 1

## 2012-08-10 MED ORDER — MAGNESIUM HYDROXIDE 400 MG/5ML PO SUSP: 30.0000 mL | Freq: Every day | ORAL | Status: DC | PRN

## 2012-08-10 MED ORDER — FLEET ENEMA 7-19 GM/118ML RE ENEM: 1.0000 | ENEMA | Freq: Once | RECTAL | Status: AC | PRN

## 2012-08-10 MED ORDER — SORBITOL 70 % SOLN
30.0000 mL | Freq: Every day | Status: DC | PRN
  Filled 2012-08-10: qty 30

## 2012-08-10 MED ORDER — ONDANSETRON HCL 4 MG/2ML IJ SOLN: 4.0000 mg | Freq: Four times a day (QID) | INTRAMUSCULAR | Status: DC | PRN

## 2012-08-10 MED ORDER — KETOROLAC TROMETHAMINE 15 MG/ML IJ SOLN
15.0000 mg | Freq: Four times a day (QID) | INTRAMUSCULAR | Status: AC
  Administered 2012-08-11 – 2012-08-12 (×5): 15 mg via INTRAVENOUS
  Filled 2012-08-10 (×8): qty 1

## 2012-08-10 MED ORDER — OXYCODONE-ACETAMINOPHEN 5-325 MG PO TABS
1.0000 | ORAL_TABLET | ORAL | Status: DC | PRN
  Administered 2012-08-11: 1 via ORAL
  Administered 2012-08-12 – 2012-08-13 (×3): 2 via ORAL
  Filled 2012-08-10 (×3): qty 2
  Filled 2012-08-10: qty 1
  Filled 2012-08-10 (×3): qty 2

## 2012-08-10 MED ORDER — SODIUM CHLORIDE 0.9 % IV SOLN: 250.0000 mL | INTRAVENOUS | Status: DC

## 2012-08-10 MED ORDER — ALUM & MAG HYDROXIDE-SIMETH 200-200-20 MG/5ML PO SUSP: 30.0000 mL | Freq: Four times a day (QID) | ORAL | Status: DC | PRN

## 2012-08-10 MED ORDER — GLYCOPYRROLATE 0.2 MG/ML IJ SOLN
INTRAMUSCULAR | Status: DC | PRN
  Administered 2012-08-10: 0.4 mg via INTRAVENOUS

## 2012-08-10 MED ORDER — BUPIVACAINE HCL (PF) 0.5 % IJ SOLN
INTRAMUSCULAR | Status: DC | PRN
  Administered 2012-08-10: 10 mL
  Administered 2012-08-10: 20 mL

## 2012-08-10 MED ORDER — RAMIPRIL 5 MG PO CAPS
5.0000 mg | ORAL_CAPSULE | Freq: Every day | ORAL | Status: DC
  Administered 2012-08-12 – 2012-08-13 (×2): 5 mg via ORAL
  Filled 2012-08-10 (×3): qty 1

## 2012-08-10 MED ORDER — LIDOCAINE HCL (CARDIAC) 20 MG/ML IV SOLN
INTRAVENOUS | Status: DC | PRN
  Administered 2012-08-10: 70 mg via INTRAVENOUS

## 2012-08-10 MED ORDER — OXYCODONE HCL 5 MG/5ML PO SOLN: 5.0000 mg | Freq: Once | ORAL | Status: DC | PRN

## 2012-08-10 MED ORDER — MIDAZOLAM HCL 5 MG/5ML IJ SOLN
INTRAMUSCULAR | Status: DC | PRN
  Administered 2012-08-10 (×2): 1 mg via INTRAVENOUS
  Administered 2012-08-10: 2 mg via INTRAVENOUS

## 2012-08-10 MED ORDER — OXYCODONE HCL 5 MG PO TABS
ORAL_TABLET | ORAL | Status: AC
  Filled 2012-08-10: qty 2

## 2012-08-10 MED ORDER — PROPOFOL 10 MG/ML IV BOLUS
INTRAVENOUS | Status: DC | PRN
  Administered 2012-08-10: 200 mg via INTRAVENOUS

## 2012-08-10 MED ORDER — ROCURONIUM BROMIDE 100 MG/10ML IV SOLN
INTRAVENOUS | Status: DC | PRN
  Administered 2012-08-10: 50 mg via INTRAVENOUS

## 2012-08-10 MED ORDER — OXYCODONE-ACETAMINOPHEN 10-325 MG PO TABS: 1.0000 | ORAL_TABLET | ORAL | Status: DC | PRN

## 2012-08-10 MED ORDER — ACETAMINOPHEN 10 MG/ML IV SOLN
INTRAVENOUS | Status: AC
  Administered 2012-08-10: 1000 mg via INTRAVENOUS
  Filled 2012-08-10: qty 100

## 2012-08-10 MED ORDER — VECURONIUM BROMIDE 10 MG IV SOLR
INTRAVENOUS | Status: DC | PRN
  Administered 2012-08-10: 1 mg via INTRAVENOUS
  Administered 2012-08-10: 2 mg via INTRAVENOUS

## 2012-08-10 MED ORDER — HYDROMORPHONE HCL PF 1 MG/ML IJ SOLN
INTRAMUSCULAR | Status: AC
  Filled 2012-08-10: qty 1

## 2012-08-10 MED ORDER — SODIUM CHLORIDE 0.9 % IJ SOLN
3.0000 mL | Freq: Two times a day (BID) | INTRAMUSCULAR | Status: DC
  Administered 2012-08-11 – 2012-08-12 (×3): 3 mL via INTRAVENOUS

## 2012-08-10 MED ORDER — BACITRACIN 50000 UNITS IM SOLR
INTRAMUSCULAR | Status: AC
  Filled 2012-08-10: qty 1

## 2012-08-10 MED ORDER — OXYCODONE HCL 5 MG PO TABS
5.0000 mg | ORAL_TABLET | Freq: Once | ORAL | Status: AC
  Administered 2012-08-10: 5 mg via ORAL
  Filled 2012-08-10: qty 1

## 2012-08-10 SURGICAL SUPPLY — 64 items
ADH SKN CLS APL DERMABOND .7 (GAUZE/BANDAGES/DRESSINGS) ×1
BAG DECANTER FOR FLEXI CONT (MISCELLANEOUS) ×2 IMPLANT
BLADE SURG ROTATE 9660 (MISCELLANEOUS) IMPLANT
BUR MATCHSTICK NEURO 3.0 LAGG (BURR) ×2 IMPLANT
CANISTER SUCTION 2500CC (MISCELLANEOUS) ×2 IMPLANT
CLOTH BEACON ORANGE TIMEOUT ST (SAFETY) ×2 IMPLANT
CONT SPEC 4OZ CLIKSEAL STRL BL (MISCELLANEOUS) ×4 IMPLANT
COVER BACK TABLE 24X17X13 BIG (DRAPES) IMPLANT
COVER TABLE BACK 60X90 (DRAPES) ×2 IMPLANT
DECANTER SPIKE VIAL GLASS SM (MISCELLANEOUS) ×2 IMPLANT
DERMABOND ADVANCED (GAUZE/BANDAGES/DRESSINGS) ×1
DERMABOND ADVANCED .7 DNX12 (GAUZE/BANDAGES/DRESSINGS) ×1 IMPLANT
DRAPE C-ARM 42X72 X-RAY (DRAPES) ×4 IMPLANT
DRAPE LAPAROTOMY 100X72X124 (DRAPES) ×2 IMPLANT
DRAPE POUCH INSTRU U-SHP 10X18 (DRAPES) ×2 IMPLANT
DRAPE PROXIMA HALF (DRAPES) IMPLANT
DURAPREP 26ML APPLICATOR (WOUND CARE) ×2 IMPLANT
ELECT REM PT RETURN 9FT ADLT (ELECTROSURGICAL) ×2
ELECTRODE REM PT RTRN 9FT ADLT (ELECTROSURGICAL) ×1 IMPLANT
GAUZE SPONGE 4X4 16PLY XRAY LF (GAUZE/BANDAGES/DRESSINGS) IMPLANT
GLOVE BIO SURGEON STRL SZ 6.5 (GLOVE) ×4 IMPLANT
GLOVE BIO SURGEON STRL SZ8 (GLOVE) ×1 IMPLANT
GLOVE BIOGEL PI IND STRL 6.5 (GLOVE) IMPLANT
GLOVE BIOGEL PI IND STRL 8.5 (GLOVE) ×2 IMPLANT
GLOVE BIOGEL PI INDICATOR 6.5 (GLOVE) ×3
GLOVE BIOGEL PI INDICATOR 8.5 (GLOVE) ×2
GLOVE ECLIPSE 8.5 STRL (GLOVE) ×4 IMPLANT
GLOVE EXAM NITRILE LRG STRL (GLOVE) IMPLANT
GLOVE EXAM NITRILE MD LF STRL (GLOVE) ×1 IMPLANT
GLOVE EXAM NITRILE XL STR (GLOVE) IMPLANT
GLOVE EXAM NITRILE XS STR PU (GLOVE) IMPLANT
GLOVE OPTIFIT SS 8.0 STRL (GLOVE) ×1 IMPLANT
GOWN BRE IMP SLV AUR LG STRL (GOWN DISPOSABLE) ×3 IMPLANT
GOWN BRE IMP SLV AUR XL STRL (GOWN DISPOSABLE) ×1 IMPLANT
GOWN STRL REIN 2XL LVL4 (GOWN DISPOSABLE) ×4 IMPLANT
KIT BASIN OR (CUSTOM PROCEDURE TRAY) ×2 IMPLANT
KIT ROOM TURNOVER OR (KITS) ×2 IMPLANT
MILL MEDIUM DISP (BLADE) ×1 IMPLANT
NEEDLE HYPO 22GX1.5 SAFETY (NEEDLE) ×2 IMPLANT
NS IRRIG 1000ML POUR BTL (IV SOLUTION) ×2 IMPLANT
PACK FOAM VITOSS 10CC (Orthopedic Implant) ×1 IMPLANT
PACK LAMINECTOMY NEURO (CUSTOM PROCEDURE TRAY) ×2 IMPLANT
PAD ARMBOARD 7.5X6 YLW CONV (MISCELLANEOUS) ×6 IMPLANT
PATTIES SURGICAL .5 X1 (DISPOSABLE) ×2 IMPLANT
PEEK PLIF NOVEL 9X25X12 (Peek) ×2 IMPLANT
ROD 35MM (Rod) ×2 IMPLANT
SCREW 35MM (Screw) ×2 IMPLANT
SCREW 40MM (Screw) ×2 IMPLANT
SCREW SET SPINAL STD HEXALOBE (Screw) ×4 IMPLANT
SPONGE GAUZE 4X4 12PLY (GAUZE/BANDAGES/DRESSINGS) ×2 IMPLANT
SPONGE LAP 4X18 X RAY DECT (DISPOSABLE) IMPLANT
SPONGE SURGIFOAM ABS GEL 100 (HEMOSTASIS) ×2 IMPLANT
SUT VIC AB 1 CT1 18XBRD ANBCTR (SUTURE) ×1 IMPLANT
SUT VIC AB 1 CT1 8-18 (SUTURE) ×4
SUT VIC AB 2-0 CP2 18 (SUTURE) ×3 IMPLANT
SUT VIC AB 3-0 SH 8-18 (SUTURE) ×3 IMPLANT
SYR 20ML ECCENTRIC (SYRINGE) ×2 IMPLANT
SYR 3ML LL SCALE MARK (SYRINGE) ×3 IMPLANT
TAPE CLOTH SURG 4X10 WHT LF (GAUZE/BANDAGES/DRESSINGS) ×1 IMPLANT
TOWEL OR 17X24 6PK STRL BLUE (TOWEL DISPOSABLE) ×2 IMPLANT
TOWEL OR 17X26 10 PK STRL BLUE (TOWEL DISPOSABLE) ×2 IMPLANT
TRAP SPECIMEN MUCOUS 40CC (MISCELLANEOUS) ×2 IMPLANT
TRAY FOLEY CATH 14FRSI W/METER (CATHETERS) ×2 IMPLANT
WATER STERILE IRR 1000ML POUR (IV SOLUTION) ×2 IMPLANT

## 2012-08-10 NOTE — Op Note (Signed)
Preoperative diagnosis: Recurrent herniated nucleus pulposus L4-5 with severe left L5 radiculopathy, foot drop Postoperative diagnosis: Recurrent herniated nucleus pulposus L4-5 with severe left L5 radiculopathy, foot drop   Procedure: L4-L5 decompressive laminectomy decompression of L4 and L5 nerve roots, posterior lumbar interbody arthrodesis with peek spacers local autograft and allograft, pedicle screw fixation L4-L5, posterior lateral arthrodesis L4-L5  Surgeon: Barnett Abu M.D.  Asst.: Tressie Stalker M.D.  Indications: Patient is [name] is a 37 y.o. female who who's had significant back pain and lumbar radiculopathy for over a years period time. A lumbar MRI demonstrates a recurrent herniated nucleus pulposus with left L5 nerve root compromise. The patient also developed a foot drop and severe pain in the past couple of weeks that was unrelenting. She was advised regarding surgical intervention.  Procedure: The patient was brought to the operating room supine on a stretcher. After the smooth induction of general endotracheal anesthesia she was turned prone and the back was prepped with alcohol and DuraPrep. The back was then draped sterilely. A midline incision was created and carried down to the lumbar dorsal fascia. A localizing radiograph identified the L4 and L5 spinous processes. A subligamentous dissection was created at L4 and L5 to expose the interlaminar space at L4 and L5 and the facet joints over the L4-L5 interspace. Laminotomies were were then created removing the entire inferior margin of the lamina of L4 including the inferior facet at the L4-L5 joint. The yellow ligament was taken up and the common dural tube was exposed along with the L4 nerve root superiorly, and the L5 nerve root inferiorly. Epidural fibrosis from previous surgery was released and the dura was carefully dissected. On the left side fragment of disc material was identified this was immediately  compressing the L5 nerve root as it entered the foramen removing it yielded several other fragments of disc. The disc space was exposed and epidural veins in this region were cauterized and divided. The L4 nerve roots and the L5 nerve root were dissected with care taken to protect them. The disc space was opened and a combination of curettes and rongeurs was used to evacuate the  disc space fully. The endplates were removed using sharp curettes. An interbody spacer was placed to distract the disc space while the contralateral discectomy was performed. When the entirety of the disc was removed and the endplates were prepared final sizing of the disc space was obtained 12 mm peek spacers were chosen and packed with autograft and allograft and placed into the interspace. The remainder of the interspace was packed with autograft and allograft. Pedicle entry sites were then chosen using fluoroscopic guidance and 6.5 x 40 mm screws were placed in L4 and 6.5 x 40 mm screws were placed in L5. The lateral gutters were decorticated and graft was packed in the posterolateral gutters between L4 and L5. Final radiographs were obtained after placing appropriately sized rods between the pedicle screws at L4-L5 and torquing these to the appropriate tension. The surgical site was inspected carefully to assure the L4 and L5 nerve roots were well decompressed, hemostasis was obtained, and the graft was well packed. Then the retractors were removed and the wound was closed with #1 Vicryl in the lumbar dorsal fascia 2-0 Vicryl in the subcutaneous tissue and 3-0 Vicryl subcuticularly. When he cc of half percent Marcaine was injected into the paraspinous musculature at the time of closure. Blood loss was estimated at 350 cc. The patient tolerated procedure well and was  returned to the recovery room in stable condition.

## 2012-08-10 NOTE — H&P (Signed)
[name] is an 37 y.o. female.   Chief Complaint: Back pain and left leg weakness left leg pain HPI: Patient is a 37 year old individual is had a previous herniated nucleus pulposus at L4-5 she underwent decompression several years ago however she's been plagued with chronic back pain and leg pain. She's been seen in pain management with Dr. Odette Goodwin had a number of epidural injections including an intradiscal injection which did not seem to give her relief rather acutely several weeks ago the pain became much more severe and she developed a left foot drop she has evidence of a recurrent herniated nucleus pulposus L4-5 on the left with compression of the exiting L5 nerve root she's been advised regarding surgical decompression of this area with posterior lateral interbody arthrodesis at L4-5. She is now admitted for this procedure.  Past Medical History  Diagnosis Date  . Hypertension   . Compression fracture of lumbar vertebra     Past Surgical History  Procedure Date  . Back surgery     History reviewed. No pertinent family history. Social History:  reports that she has been smoking Cigarettes.  She has a 3.75 pack-year smoking history. She has never used smokeless tobacco. She reports that she drinks alcohol. She reports that she does not use illicit drugs.  Allergies: No Known Allergies  Medications Prior to Admission  Medication Sig Dispense Refill  . DULoxetine (CYMBALTA) 60 MG capsule Take 60 mg by mouth daily.      Marland Kitchen HYDROmorphone (DILAUDID) 4 MG tablet Take 1 tablet (4 mg total) by mouth every 4 (four) hours as needed for pain.  10 tablet  0  . Multiple Vitamin (MULTIVITAMIN WITH MINERALS) TABS Take 1 tablet by mouth daily.      . Norethindrone Acetate-Ethinyl Estradiol (JUNEL 1.5/30) 1.5-30 MG-MCG tablet Take 1 tablet by mouth daily.      Marland Kitchen oxyCODONE-acetaminophen (PERCOCET) 10-325 MG per tablet Take 1 tablet by mouth every 4 (four) hours as needed. For pain      .  ramipril (ALTACE) 5 MG capsule Take 1 capsule (5 mg total) by mouth daily.  90 capsule  3    Results for orders placed during the hospital encounter of 08/10/12 (from the past 48 hour(s))  BASIC METABOLIC PANEL     Status: Normal   Collection Time   08/10/12  1:11 PM      Component Value Range Comment   Sodium 138  135 - 145 mEq/L    Potassium 3.9  3.5 - 5.1 mEq/L    Chloride 104  96 - 112 mEq/L    CO2 25  19 - 32 mEq/L    Glucose, Bld 87  70 - 99 mg/dL    BUN 15  6 - 23 mg/dL    Creatinine, Ser 5.62  0.50 - 1.10 mg/dL    Calcium 8.9  8.4 - 13.0 mg/dL    GFR calc non Af Amer >90  >90 mL/min    GFR calc Af Amer >90  >90 mL/min   CBC     Status: Abnormal   Collection Time   08/10/12  1:11 PM      Component Value Range Comment   WBC 13.5 (*) 4.0 - 10.5 K/uL    RBC 4.38  3.87 - 5.11 MIL/uL    Hemoglobin 13.6  12.0 - 15.0 g/dL    HCT 86.5  78.4 - 69.6 %    MCV 94.1  78.0 - 100.0 fL  MCH 31.1  26.0 - 34.0 pg    MCHC 33.0  30.0 - 36.0 g/dL    RDW 14.7  82.9 - 56.2 %    Platelets 350  150 - 400 K/uL   HCG, SERUM, QUALITATIVE     Status: Normal   Collection Time   08/10/12  1:11 PM      Component Value Range Comment   Preg, Serum NEGATIVE  NEGATIVE   SURGICAL PCR SCREEN     Status: Normal   Collection Time   08/10/12  1:12 PM      Component Value Range Comment   MRSA, PCR NEGATIVE  NEGATIVE    Staphylococcus aureus NEGATIVE  NEGATIVE    Dg Chest 2 View  08/10/2012  *RADIOLOGY REPORT*  Clinical Data: Hypertension; preoperative evaluation  CHEST - 2 VIEW  Comparison: None.  Findings: Lungs are clear.  Heart size and pulmonary vascularity are normal.  No adenopathy.  No bone lesions.  IMPRESSION: No abnormality noted.   Original Report Authenticated By: Bretta Bang, M.D.     Review of Systems  Constitutional: Positive for malaise/fatigue.       Back pain  HENT: Negative.   Eyes: Negative.   Respiratory: Negative.   Cardiovascular: Negative.   Genitourinary: Negative.    Musculoskeletal: Positive for back pain.  Skin: Negative.   Neurological: Positive for tingling, sensory change, focal weakness and weakness.  Endo/Heme/Allergies: Negative.   Psychiatric/Behavioral: The patient is nervous/anxious.     Height 5\' 2"  (1.575 m), weight 58.4 kg (128 lb 12 oz), last menstrual period 07/31/2012. Physical Exam  Constitutional: She is oriented to person, place, and time. She appears well-developed and well-nourished.  HENT:  Head: Normocephalic and atraumatic.  Eyes: Conjunctivae normal and EOM are normal. Pupils are equal, round, and reactive to light.  Neck: Normal range of motion. Neck supple.  Cardiovascular: Normal rate and regular rhythm.   Respiratory: Effort normal and breath sounds normal.  GI: Soft. Bowel sounds are normal.  Musculoskeletal:       Markedly positive straight leg raising on the left at 15 positive straight leg raising on right for left leg pain at 45. Complete foot drop and left lower extremity with no function extensor else as long as her tibialis anterior  Neurological: She is alert and oriented to person, place, and time.       Absent patella reflex on the left.  Skin: Skin is warm and dry.  Psychiatric:       Anxious     Assessment/Plan  The MRI demonstrates that she has a substantial recurrent disc herniation at L4-5 on the left side causing compression of the exiting L5 nerve root.  The disc space itself is markedly degenerated.  There is substantial facet hypertrophy of both joints.    I demonstrated the findings to her.  I note that this is a recurrent disc herniation and [name] has had a previous disc herniation at this level that was decompressed several years ago.  She is quite tearful and upset and notes that the pain has been unrelenting.  She is quite concerned that she has been immobilized and cannot do her activities of daily living including taking care of her little daughter.    I described the findings to her.   I believe that [name] ultimately needs to have this area decompressed but not only decompressed but she needs to have this joint stabilized. I noted that after her last surgery she had a difficult recovery  with persistence of pain both radicular and centralized back pain that has required ongoing treatment to this point.  Apollonia otherwise has a very healthy looking back with all the other discs being completely normal.  L4-5 has advanced degenerative changes at this point and I believe appropriate treatment of this process should include diskectomy and decompression of a posterior interbody arthrodesis with pedicle screw fixation across L4-5.    Though this is a substantially greater surgery than what Tymara had previously, I believe that ultimately it should lead to good resolution of the worst of her symptoms and hopefully return to a more useful and functional lifestyle for her.  I did indicate that she will not be able to lift her baby for a period of about two months after surgery and thereafter she should be cautious with any lifting activities until she is fully healed.  However, the end-point would be that she would have a lifestyle that could be unrestricted in any fashion.  The resolution of her foot drop remains uncertain however, in that at this point I see now evidence of any function in the extensor hallucis longus or the tibialis anterior group.   Breianna Delfino J 08/10/2012, 7:18 PM

## 2012-08-10 NOTE — Progress Notes (Signed)
Called Dr. Chaney Malling about patient's pain level rated at a 10 and radiating down buttock and left leg. Order obtained for oxycodone 10/325mg  po per patient's home meds, after patient stated that the dilaudid she was taking didn't help but the oxycodone does help. Pharmacist, Homero Fellers called to clarify giving oxyIR 5mg  and oxycodone 5/325mg  to make up the 10/325mg . He stated that is what they dispense to make the oxycodone 10/325mg , and he put the order in for me.

## 2012-08-10 NOTE — Transfer of Care (Signed)
Immediate Anesthesia Transfer of Care Note  Patient: [name]  Procedure(s) Performed: Procedure(s) (LRB) with comments: POSTERIOR LUMBAR FUSION 1 LEVEL (N/A) - Posterior Lumbar Four-Five Bilateral Decompression and Posterior Lumbar Interbody Fusion/Peek Spacers/Pedicle Screws  Patient Location: PACU  Anesthesia Type:General  Level of Consciousness: awake and confused  Airway & Oxygen Therapy: Patient Spontanous Breathing and Patient connected to nasal cannula oxygen  Post-op Assessment: Report given to PACU RN and Post -op Vital signs reviewed and stable  Post vital signs: Reviewed and stable  Complications: No apparent anesthesia complications

## 2012-08-10 NOTE — Anesthesia Procedure Notes (Signed)
Procedure Name: Intubation Date/Time: 08/10/2012 7:50 PM Performed by: Garen Lah Pre-anesthesia Checklist: Patient identified, Timeout performed, Emergency Drugs available, Suction available and Patient being monitored Patient Re-evaluated:Patient Re-evaluated prior to inductionOxygen Delivery Method: Circle system utilized Preoxygenation: Pre-oxygenation with 100% oxygen Intubation Type: IV induction Ventilation: Mask ventilation without difficulty Laryngoscope Size: Mac and 3 Grade View: Grade I Tube type: Oral Tube size: 7.5 mm Number of attempts: 1 Airway Equipment and Method: Stylet Placement Confirmation: ETT inserted through vocal cords under direct vision,  positive ETCO2 and breath sounds checked- equal and bilateral Secured at: 21 cm Tube secured with: Tape Dental Injury: Teeth and Oropharynx as per pre-operative assessment

## 2012-08-10 NOTE — Preoperative (Signed)
Beta Blockers   Reason not to administer Beta Blockers:Not ApplicableACE inhibitor therapy was not prescribed due to . No home beta blockers

## 2012-08-10 NOTE — Anesthesia Postprocedure Evaluation (Signed)
  Anesthesia Post-op Note  Patient: [name]  Procedure(s) Performed: Procedure(s) (LRB) with comments: POSTERIOR LUMBAR FUSION 1 LEVEL (N/A) - Posterior Lumbar Four-Five Bilateral Decompression and Posterior Lumbar Interbody Fusion/Peek Spacers/Pedicle Screws  Patient Location: PACU  Anesthesia Type:General  Level of Consciousness: awake, alert  and oriented  Airway and Oxygen Therapy: Patient Spontanous Breathing and Patient connected to nasal cannula oxygen  Post-op Pain: mild  Post-op Assessment: Post-op Vital signs reviewed  Post-op Vital Signs: Reviewed  Complications: No apparent anesthesia complications

## 2012-08-10 NOTE — Anesthesia Preprocedure Evaluation (Addendum)
Anesthesia Evaluation  Patient identified by MRN, date of birth, ID band Patient awake    Reviewed: Allergy & Precautions, H&P , NPO status , Patient's Chart, lab work & pertinent test results, reviewed documented beta blocker date and time   Airway Mallampati: II  Neck ROM: full    Dental  (+) Teeth Intact and Dental Advisory Given   Pulmonary Current Smoker,  breath sounds clear to auscultation        Cardiovascular hypertension, Pt. on medications Rhythm:Regular     Neuro/Psych    GI/Hepatic   Endo/Other    Renal/GU      Musculoskeletal   Abdominal (+)  Abdomen: soft. Bowel sounds: normal.  Peds  Hematology   Anesthesia Other Findings   Reproductive/Obstetrics                        Anesthesia Physical Anesthesia Plan  ASA: II  Anesthesia Plan: General   Post-op Pain Management:    Induction: Intravenous  Airway Management Planned: Oral ETT  Additional Equipment:   Intra-op Plan:   Post-operative Plan: Extubation in OR  Informed Consent: I have reviewed the patients History and Physical, chart, labs and discussed the procedure including the risks, benefits and alternatives for the proposed anesthesia with the patient or authorized representative who has indicated his/her understanding and acceptance.     Plan Discussed with: CRNA and Surgeon  Anesthesia Plan Comments:         Anesthesia Quick Evaluation

## 2012-08-11 ENCOUNTER — Ambulatory Visit: Payer: BC Managed Care – PPO | Admitting: Family

## 2012-08-11 MED ORDER — KETOROLAC TROMETHAMINE 30 MG/ML IJ SOLN
INTRAMUSCULAR | Status: AC
  Filled 2012-08-11: qty 1

## 2012-08-11 MED ORDER — OXYCODONE HCL 5 MG PO TABS
5.0000 mg | ORAL_TABLET | ORAL | Status: DC | PRN
  Administered 2012-08-11 – 2012-08-13 (×6): 5 mg via ORAL
  Filled 2012-08-11 (×6): qty 1

## 2012-08-11 MED ORDER — OXYCODONE-ACETAMINOPHEN 5-325 MG PO TABS
1.0000 | ORAL_TABLET | ORAL | Status: DC | PRN
  Administered 2012-08-11 – 2012-08-13 (×6): 1 via ORAL
  Filled 2012-08-11 (×5): qty 1

## 2012-08-11 NOTE — Progress Notes (Signed)
Orthopedic Tech Progress Note Patient Details:  [name] [birth year] [id number] Order completed Patient ID: [name], female   DOB: 1975/12/15, 37 y.o.   MRN: [id number]   Orie Rout 08/11/2012, 10:52 AM

## 2012-08-11 NOTE — Progress Notes (Signed)
Occupational Therapy Treatment Patient Details Name: [name] MRN: [id number] DOB: 1975-12-10 Today's Date: 08/11/2012 Time: 4098-1191 OT Time Calculation (min): 39 min  OT Assessment / Plan / Recommendation Comments on Treatment Session      Follow Up Recommendations  Supervision - Intermittent;No OT follow up    Barriers to Discharge  None    Equipment Recommendations  None recommended by OT    Recommendations for Other Services    Frequency Min 2X/week   Plan      Precautions / Restrictions Precautions Precautions: Back Precaution Booklet Issued: Yes (comment) Precaution Comments: educated pt on log rolling and "BAT" back precautions Required Braces or Orthoses: Spinal Brace Spinal Brace: Lumbar corset;Applied in sitting position Restrictions Weight Bearing Restrictions: No   Pertinent Vitals/Pain     ADL  Eating/Feeding: Independent Where Assessed - Eating/Feeding: Chair Grooming: Teeth care;Minimal assistance Where Assessed - Grooming: Supported standing Upper Body Bathing: Minimal assistance Where Assessed - Upper Body Bathing: Supported sitting Lower Body Bathing: Minimal assistance Where Assessed - Lower Body Bathing: Supported sit to stand Upper Body Dressing: Minimal assistance Where Assessed - Upper Body Dressing: Unsupported sitting Lower Body Dressing: Minimal assistance Where Assessed - Lower Body Dressing: Supported sit to Pharmacist, hospital: Minimal assistance Toilet Transfer Method: Sit to Barista: Comfort height toilet Toileting - Clothing Manipulation and Hygiene: Minimal assistance Where Assessed - Glass blower/designer Manipulation and Hygiene: Standing Equipment Used: Back brace;Rolling walker Transfers/Ambulation Related to ADLs: Pt ambulates with min A.  Requires cues to avoid twisting when she turns ADL Comments: Pt instructed in back precautions, donning/doffing brace (required mod A), and safe techniques  for LB ADLs and to avoid bending with mouth care at sink    OT Diagnosis: Generalized weakness;Acute pain  OT Problem List: Decreased strength;Decreased activity tolerance;Pain;Decreased knowledge of precautions;Decreased knowledge of use of DME or AE OT Treatment Interventions: Self-care/ADL training;DME and/or AE instruction;Therapeutic activities;Patient/family education   OT Goals Acute Rehab OT Goals OT Goal Formulation: With patient Time For Goal Achievement: 08/18/12 Potential to Achieve Goals: Good ADL Goals Pt Will Perform Lower Body Bathing: with supervision;Sit to stand from chair;Sit to stand from bed ADL Goal: Lower Body Bathing - Progress: Goal set today Pt Will Perform Lower Body Dressing: with supervision;Sit to stand from chair;Sit to stand from bed ADL Goal: Lower Body Dressing - Progress: Goal set today Pt Will Transfer to Toilet: with supervision;Ambulation;Comfort height toilet ADL Goal: Toilet Transfer - Progress: Goal set today Pt Will Perform Toileting - Clothing Manipulation: with supervision;Standing ADL Goal: Toileting - Clothing Manipulation - Progress: Goal set today Pt Will Perform Toileting - Hygiene: with supervision;Sit to stand from 3-in-1/toilet ADL Goal: Toileting - Hygiene - Progress: Goal set today Pt Will Perform Tub/Shower Transfer: Ambulation;with DME (min guard) ADL Goal: Tub/Shower Transfer - Progress: Goal set today Additional ADL Goal #1: Pt will be independent with Back precautions during all ADL activities ADL Goal: Additional Goal #1 - Progress: Goal set today  Visit Information  Last OT Received On: 08/11/12 Assistance Needed: +1    Subjective Data  Subjective: "How long til I can walk without this thing" Patient Stated Goal: To get back to normal without back pain   Prior Functioning  Home Living Lives With: Spouse;Other (Comment);Daughter;Son (18 months, 12 years) Available Help at Discharge: Family;Available  PRN/intermittently Type of Home: House Home Access: Stairs to enter Entrance Stairs-Rails: None Home Layout: One level Bathroom Shower/Tub: Forensic scientist: Standard Bathroom Accessibility: Yes How  Accessible: Accessible via walker Home Adaptive Equipment: Shower chair with back;Walker - rolling Additional Comments: Pt reports that family will be assisting with her 51 mos old Prior Function Level of Independence: Independent Able to Take Stairs?: Yes Driving: Yes Vocation: Works at home Communication Communication: No difficulties Dominant Hand: Right    Cognition  Overall Cognitive Status: Appears within functional limits for tasks assessed/performed Arousal/Alertness: Awake/alert Orientation Level: Appears intact for tasks assessed Behavior During Session: [facility] for tasks performed    Mobility  Shoulder Instructions Bed Mobility Bed Mobility: Rolling Left;Left Sidelying to Sit;Sitting - Scoot to [place] of Bed Rolling Left: 4: Min guard Left Sidelying to Sit: 4: Min assist Sitting - Scoot to [place] of Bed: 4: Min guard Details for Bed Mobility Assistance: vc's for back precautions and proper technique, slight assist needed to move side to sit Transfers Transfers: Stand to Sit;Sit to Stand Sit to Stand: 4: Min assist;From bed;With upper extremity assist Stand to Sit: 4: Min assist;With armrests;To chair/3-in-1 Details for Transfer Assistance: cues for technique and hand placement, only slight physical assist needed       Exercises      Balance Balance Balance Assessed: Yes Dynamic Standing Balance Dynamic Standing - Balance Support: Left upper extremity supported Dynamic Standing - Level of Assistance: Other (comment);4: Min assist (grooming at sink)   End of Session OT - End of Session Equipment Utilized During Treatment: Back brace Activity Tolerance: Patient tolerated treatment well Patient left: in chair;with call bell/phone within  reach;with family/visitor present Nurse Communication: Patient requests pain meds  GO     Joseph Johns M 08/11/2012, 12:53 PM

## 2012-08-11 NOTE — Progress Notes (Addendum)
Patient arousable but lethargic, unsure as to where she is and why she is in the hospital. Reoriented patient. VSS.  Will continue to monitor patient. Foley not removed due to patient being so lethargic.  [name]

## 2012-08-11 NOTE — Clinical Social Work Note (Signed)
Clinical Social Work   CSW received consult for SNF. CSW reviewed chart and discussed pt with RN during progression. Awaiting PT/OT evals for discharge recommendations. CSW will assess for SNF, if appropriate. Please call with any urgent needs. CSW will continue to follow.   Correen Bubolz, MSW, LCSWA #[phone] 

## 2012-08-11 NOTE — Clinical Social Work Note (Signed)
Clinical Social Work   CSW reviewed chart and PT/OT are not recommending any follow up. CSW will update RNCM. CSW is signing off as no further needs identified. Please reconsult if a need arises prior to discharge.   Dede Query, MSW, Theresia Majors [phone]

## 2012-08-11 NOTE — Evaluation (Signed)
Physical Therapy Evaluation Patient Details Name: [name] MRN: [id number] DOB: 1975/07/18 Today's Date: 08/11/2012 Time: 6962-9528 PT Time Calculation (min): 33 min  PT Assessment / Plan / Recommendation Clinical Impression  Pt. was admitteed with back pain and left LE radiculopathy/foot drop due to recurrent HNP L4-5 .  Presents to PT with decreased functional mobility/gait and pain and will benefit from PT to address these areas.  Expect she will progress to mod I level for DC home but will need ongoing assist to care for her 75 month old child.    PT Assessment  Patient needs continued PT services    Follow Up Recommendations  No PT follow up    Does the patient have the potential to tolerate intense rehabilitation      Barriers to Discharge Decreased caregiver support      Equipment Recommendations  Rolling walker with 5" wheels    Recommendations for Other Services     Frequency Min 6X/week    Precautions / Restrictions Precautions Precautions: Back Precaution Booklet Issued: Yes (comment) Precaution Comments: educated pt on log rolling and "BAT" back precautions Required Braces or Orthoses: Spinal Brace Spinal Brace: Lumbar corset;Applied in sitting position Restrictions Weight Bearing Restrictions: No   Pertinent Vitals/Pain Pain 7/10 in back and left leg, RN made aware and provided pain med to pt.  Also positioned pt. For comfort.      Mobility  Bed Mobility Bed Mobility: Rolling Left;Left Sidelying to Sit;Sitting - Scoot to [place] of Bed Rolling Left: 4: Min guard Left Sidelying to Sit: 4: Min assist Sitting - Scoot to [place] of Bed: 4: Min guard Details for Bed Mobility Assistance: vc's for back precautions and proper technique, slight assist needed to move side to sit Transfers Transfers: Sit to Stand;Stand to Sit Sit to Stand: 4: Min assist;From bed;With upper extremity assist Stand to Sit: 4: Min assist;With armrests;To chair/3-in-1 Details for  Transfer Assistance: cues for technique and hand placement, only slight physical assist needed Ambulation/Gait Ambulation/Gait Assistance: 4: Min assist Ambulation Distance (Feet): 25 Feet Assistive device: Rolling walker Ambulation/Gait Assistance Details: Pt. needed occasional assist to manage RW (especially in turns), and vc's for safety and technique Gait Pattern: Step-through pattern;Decreased step length - right;Decreased step length - left Gait velocity: slowed Stairs: No Wheelchair Mobility Wheelchair Mobility: No    Shoulder Instructions     Exercises     PT Diagnosis: Difficulty walking;Acute pain  PT Problem List: Decreased activity tolerance;Decreased mobility;Decreased knowledge of use of DME;Decreased knowledge of precautions;Pain PT Treatment Interventions: DME instruction;Gait training;Stair training;Functional mobility training;Therapeutic activities;Patient/family education   PT Goals Acute Rehab PT Goals PT Goal Formulation: With patient Time For Goal Achievement: 08/18/12 Potential to Achieve Goals: Good Pt will Roll Supine to Right Side: with modified independence PT Goal: Rolling Supine to Right Side - Progress: Goal set today Pt will Roll Supine to Left Side: with modified independence PT Goal: Rolling Supine to Left Side - Progress: Goal set today Pt will go Supine/Side to Sit: with modified independence PT Goal: Supine/Side to Sit - Progress: Goal set today Pt will go Sit to Supine/Side: with modified independence PT Goal: Sit to Supine/Side - Progress: Goal set today Pt will go Sit to Stand: with modified independence PT Goal: Sit to Stand - Progress: Goal set today Pt will go Stand to Sit: with modified independence PT Goal: Stand to Sit - Progress: Goal set today Pt will Transfer Bed to Chair/Chair to Bed: with modified independence PT Transfer  Goal: Bed to Chair/Chair to Bed - Progress: Goal set today Pt will Ambulate: >150 feet;with modified  independence;with least restrictive assistive device PT Goal: Ambulate - Progress: Goal set today Pt will Go Up / Down Stairs: 3-5 stairs;with min assist PT Goal: Up/Down Stairs - Progress: Goal set today  Visit Information  Last PT Received On: 08/11/12 Assistance Needed: +1 PT/OT Co-Evaluation/Treatment: Yes    Subjective Data  Subjective: "Can you take this tube out?"  (foley catheter) Patient Stated Goal: get home and be able to take care of her 101 month old   Prior Functioning  Home Living Lives With: Spouse;Other (Comment);Daughter;Son (18 months, 12 years) Available Help at Discharge: Family;Available PRN/intermittently Type of Home: House Home Access: Stairs to enter Entrance Stairs-Rails: None Home Layout: One level Bathroom Shower/Tub: Forensic scientist: Standard Bathroom Accessibility: Yes How Accessible: Accessible via walker Home Adaptive Equipment: Shower chair with back;Walker - rolling Prior Function Level of Independence: Independent Able to Take Stairs?: Yes Driving: Yes Vocation: Works at home Communication Communication: No difficulties Dominant Hand: Right    Cognition  Overall Cognitive Status: Appears within functional limits for tasks assessed/performed Arousal/Alertness: Awake/alert Orientation Level: Appears intact for tasks assessed Behavior During Session: [facility] for tasks performed    Extremity/Trunk Assessment Right Upper Extremity Assessment RUE ROM/Strength/Tone: [facility] for tasks assessed Left Upper Extremity Assessment LUE ROM/Strength/Tone: [facility] for tasks assessed Right Lower Extremity Assessment RLE ROM/Strength/Tone: WFL for tasks assessed RLE Sensation: WFL - Light Touch Left Lower Extremity Assessment LLE ROM/Strength/Tone: WFL for tasks assessed Trunk Assessment Trunk Assessment: Normal   Balance    End of Session PT - End of Session Equipment Utilized During Treatment: Gait belt;Back brace Activity  Tolerance: Patient tolerated treatment well;Patient limited by pain Patient left: in chair;with call bell/phone within reach;with nursing in room Nurse Communication: Mobility status;Patient requests pain meds  GP Functional Assessment Tool Used: clinical observation Functional Limitation: Mobility: Walking and moving around Mobility: Walking and Moving Around Current Status [phone]): At least 40 percent but less than 60 percent impaired, limited or restricted Mobility: Walking and Moving Around Goal Status [phone]): At least 20 percent but less than 40 percent impaired, limited or restricted   Ferman Hamming 08/11/2012, 12:47 PM Weldon Picking PT Acute Rehab Services [phone] Beeper [phone]

## 2012-08-11 NOTE — Progress Notes (Signed)
Patient admitted to room 4N26. Report received from PACU Rn, Patient arousable and alert. Sleeping quietly in bed. Bed alarm on. Dressing to back, clean dry and intact. VSS. No signs or symptoms of distress noted. Will continue to monitor patient.  [name]

## 2012-08-11 NOTE — Progress Notes (Signed)
Physical Therapy Treatment Patient Details Name: [name] MRN: [id number] DOB: [birth year] Today's Date: 08/11/2012 Time: 4696-2952 PT Time Calculation (min): 24 min  PT Assessment / Plan / Recommendation Comments on Treatment Session  Pt. able to negotiate steps without railing and min  hand hold assist from PT.  she has made good gains this afternoon with mobility.      Follow Up Recommendations  No PT follow up     Does the patient have the potential to tolerate intense rehabilitation     Barriers to Discharge Decreased caregiver support      Equipment Recommendations  Rolling walker with 5" wheels    Recommendations for Other Services    Frequency Min 6X/week   Plan Discharge plan remains appropriate;Frequency remains appropriate    Precautions / Restrictions Precautions Precautions: Back Precaution Booklet Issued: Yes (comment) Precaution Comments: reinforced precautions and log rolling Required Braces or Orthoses: Spinal Brace Spinal Brace: Lumbar corset;Applied in sitting position Restrictions Weight Bearing Restrictions: No   Pertinent Vitals/Pain Pain 4/10 in back but not wanting pain med.    Mobility  Bed Mobility Bed Mobility: Rolling Right;Right Sidelying to Sit;Sitting - Scoot to Edge of Bed;Sit to Sidelying Right Rolling Right: 6: Modified independent (Device/Increase time);With rail Rolling Left: 4: Min guard Right Sidelying to Sit: 4: Min guard Left Sidelying to Sit: 4: Min assist Sitting - Scoot to [place] of Bed: 6: Modified independent (Device/Increase time) Details for Bed Mobility Assistance: cues for techjique and back precautions as pt. tended to try to sit up without rolling first Transfers Transfers: Sit to Stand;Stand to Sit Sit to Stand: 4: Min guard;From bed;With upper extremity assist Stand to Sit: 4: Min guard;With upper extremity assist;To bed Details for Transfer Assistance: reinforcement for hand placement and  technique Ambulation/Gait Ambulation/Gait Assistance: 4: Min guard Ambulation Distance (Feet): 120 Feet Assistive device: Rolling walker;None Ambulation/Gait Assistance Details: Ambulated forst half of walk with RW and last half without device with no noticible increase in difficulty or instability. Gait Pattern: Step-through pattern Gait velocity: slowed Stairs: Yes Stairs Assistance: 4: Min assist Stairs Assistance Details (indicate cue type and reason): cues to try without use of railing as there is no railing at home Stair Management Technique: No rails;Step to pattern;Forwards Number of Stairs: 3  Wheelchair Mobility Wheelchair Mobility: Yes    Exercises     PT Diagnosis: Difficulty walking;Acute pain  PT Problem List: Decreased activity tolerance;Decreased mobility;Decreased knowledge of use of DME;Decreased knowledge of precautions;Pain PT Treatment Interventions: DME instruction;Gait training;Stair training;Functional mobility training;Therapeutic activities;Patient/family education   PT Goals Acute Rehab PT Goals PT Goal Formulation: With patient Time For Goal Achievement: 08/18/12 Potential to Achieve Goals: Good Pt will Roll Supine to Right Side: with modified independence PT Goal: Rolling Supine to Right Side - Progress: Progressing toward goal Pt will Roll Supine to Left Side: with modified independence PT Goal: Rolling Supine to Left Side - Progress: Goal set today Pt will go Supine/Side to Sit: with modified independence PT Goal: Supine/Side to Sit - Progress: Goal set today Pt will go Sit to Supine/Side: with modified independence PT Goal: Sit to Supine/Side - Progress: Progressing toward goal Pt will go Sit to Stand: with modified independence PT Goal: Sit to Stand - Progress: Progressing toward goal Pt will go Stand to Sit: with modified independence PT Goal: Stand to Sit - Progress: Progressing toward goal Pt will Transfer Bed to Chair/Chair to Bed: with  modified independence PT Transfer Goal: Bed to Chair/Chair to  Bed - Progress: Goal set today Pt will Ambulate: >150 feet;with modified independence;with least restrictive assistive device PT Goal: Ambulate - Progress: Progressing toward goal Pt will Go Up / Down Stairs: 3-5 stairs;with min assist PT Goal: Up/Down Stairs - Progress: Progressing toward goal Additional Goals Additional Goal #1: pt will state and comply with 3/3 back precations and log roll technique PT Goal: Additional Goal #1 - Progress: Goal set today  Visit Information  Last PT Received On: 08/11/12 Assistance Needed: +1 PT/OT Co-Evaluation/Treatment: Yes    Subjective Data  Subjective: "I used the bathroom on my own!" Patient Stated Goal: get home and be able to take care of her 38 month old   Cognition  Overall Cognitive Status: Appears within functional limits for tasks assessed/performed Arousal/Alertness: Awake/alert Orientation Level: Appears intact for tasks assessed Behavior During Session: [facility] for tasks performed    Balance  Balance Balance Assessed: Yes Dynamic Standing Balance Dynamic Standing - Balance Support: Left upper extremity supported Dynamic Standing - Level of Assistance: Other (comment);4: Min assist (grooming at sink)  End of Session PT - End of Session Equipment Utilized During Treatment: Gait belt;Back brace Activity Tolerance: Patient tolerated treatment well Patient left: in bed;with call bell/phone within reach;with family/visitor present Nurse Communication: Mobility status   GP Functional Assessment Tool Used: clinical observation Functional Limitation: Mobility: Walking and moving around Mobility: Walking and Moving Around Current Status [phone]): At least 40 percent but less than 60 percent impaired, limited or restricted Mobility: Walking and Moving Around Goal Status [phone]): At least 20 percent but less than 40 percent impaired, limited or restricted   Ferman Hamming 08/11/2012, 3:46 PM Weldon Picking PT Acute Rehab Services [phone] Beeper [phone]

## 2012-08-12 NOTE — Progress Notes (Signed)
Physical Therapy Treatment Patient Details Name: [name] MRN: [id number] DOB: Dec 06, 1975 Today's Date: 08/12/2012 Time: 4098-1191 PT Time Calculation (min): 23 min  PT Assessment / Plan / Recommendation Comments on Treatment Session  Pt pleasant & willing to participate in therapy.  Very motivated.  Overall pt moving well with min cueing to reinforce back precautions with functional activities.   During stair training, pt wanting to attempt with no UE support but had difficult time & was not safe-- strongly encouraged pt to have (A).      Follow Up Recommendations  No PT follow up     Does the patient have the potential to tolerate intense rehabilitation     Barriers to Discharge        Equipment Recommendations  Rolling walker with 5" wheels    Recommendations for Other Services    Frequency Min 6X/week   Plan Discharge plan remains appropriate;Frequency remains appropriate    Precautions / Restrictions Precautions Precautions: Back Precaution Comments: reinforced precautions and log rolling Required Braces or Orthoses: Spinal Brace Spinal Brace: Lumbar corset;Applied in sitting position Restrictions Weight Bearing Restrictions: No       Mobility  Bed Mobility Bed Mobility: Rolling Right;Right Sidelying to Sit;Sitting - Scoot to [place] of Bed;Sit to Sidelying Right Rolling Right: 6: Modified independent (Device/Increase time) Right Sidelying to Sit: 6: Modified independent (Device/Increase time) Sitting - Scoot to Edge of Bed: 6: Modified independent (Device/Increase time) Sit to Sidelying Right: 5: Supervision;HOB flat Details for Bed Mobility Assistance: Cues to reinforce back precautions.   Transfers Transfers: Sit to Stand;Stand to Sit Sit to Stand: 6: Modified independent (Device/Increase time);With upper extremity assist;From bed Stand to Sit: 6: Modified independent (Device/Increase time);With upper extremity assist;To bed Ambulation/Gait Ambulation/Gait  Assistance: 5: Supervision Ambulation Distance (Feet): 300 Feet Assistive device: Rolling walker Ambulation/Gait Assistance Details: Pt reports incisional discomfort/pain limits her ability to stand upright & relax UE's.   Gait Pattern: Step-through pattern;Decreased stride length;Trunk flexed (decreased floor clearance.  ) Stairs: Yes Stairs Assistance: 4: Min assist Stairs Assistance Details (indicate cue type and reason): Cues for sequencing & technique.  Pt wanting to trial with no UE support but required increased (A) for stability as well as she had difficult time getting foot completely onto step.   Stair Management Technique: Step to pattern;Other (comment);Forwards (HHA) Number of Stairs: 3  (2x's) Wheelchair Mobility Wheelchair Mobility: No      PT Goals Acute Rehab PT Goals Time For Goal Achievement: 08/18/12 Potential to Achieve Goals: Good Pt will Roll Supine to Right Side: with modified independence PT Goal: Rolling Supine to Right Side - Progress: Met Pt will Roll Supine to Left Side: with modified independence Pt will go Supine/Side to Sit: with modified independence PT Goal: Supine/Side to Sit - Progress: Met Pt will go Sit to Supine/Side: with modified independence PT Goal: Sit to Supine/Side - Progress: Progressing toward goal Pt will go Sit to Stand: with modified independence PT Goal: Sit to Stand - Progress: Met Pt will go Stand to Sit: with modified independence PT Goal: Stand to Sit - Progress: Met Pt will Transfer Bed to Chair/Chair to Bed: with modified independence Pt will Ambulate: >150 feet;with modified independence;with least restrictive assistive device PT Goal: Ambulate - Progress: Progressing toward goal Pt will Go Up / Down Stairs: 3-5 stairs;with min assist PT Goal: Up/Down Stairs - Progress: Met Additional Goals Additional Goal #1: pt will state and comply with 3/3 back precations and log roll technique PT Goal:  Additional Goal #1 - Progress:  Progressing toward goal  Visit Information  Last PT Received On: 08/12/12 Assistance Needed: +1    Subjective Data      Cognition  Overall Cognitive Status: Appears within functional limits for tasks assessed/performed Arousal/Alertness: Awake/alert Orientation Level: Appears intact for tasks assessed Behavior During Session: [facility] for tasks performed    Balance     End of Session PT - End of Session Equipment Utilized During Treatment: Gait belt;Back brace Activity Tolerance: Patient tolerated treatment well Patient left: in bed;with call bell/phone within reach Nurse Communication: Mobility status     Verdell Face, [place] [phone] 08/12/2012

## 2012-08-12 NOTE — Progress Notes (Signed)
Occupational Therapy Treatment Patient Details Name: [name] MRN: [id number] DOB: 01/26/1976 Today's Date: 08/12/2012 Time: 6295-2841 OT Time Calculation (min): 25 min  OT Assessment / Plan / Recommendation Comments on Treatment Session Pt making good progress with OT.  Has reached modified independent to supervision level for functional transfers and simulated selfcare tasks.  Continues to need occasional reminders to remember safe techniques for sit to stand and to recall back precautions.  Will continue to follow for OT in acute care.    Follow Up Recommendations  Supervision - Intermittent       Equipment Recommendations  None recommended by OT       Frequency Min 2X/week   Plan Discharge plan remains appropriate    Precautions / Restrictions Precautions Precautions: Back Required Braces or Orthoses: Spinal Brace Spinal Brace: Lumbar corset;Applied in sitting position Restrictions Weight Bearing Restrictions: No   Pertinent Vitals/Pain Pt with minimal pain during session, repositioned on her side at conclusion of therapy    ADL  Upper Body Dressing: Simulated;Modified independent;Other (comment) (To donn and doff lumbar corsette) Where Assessed - Upper Body Dressing: Unsupported sitting Tub/Shower Transfer: Performed;Supervision/safety Tub/Shower Transfer Method: Science writer: Shower seat with back Equipment Used: Rolling walker;Back brace Transfers/Ambulation Related to ADLs: Pt able to ambulate with the lumbar corset and RW with supervision level. ADL Comments: Pt able to state 2/3 back precautions.  Needed min instructional cues for correct hand placement with sit to stand transitions.  Educated on performance of tub/shower transfers by stepping over the edge of the tub.  Also discussed LB dressing by crossing her LEs at the ankles.    OT Goals ADL Goals ADL Goal: Tub/Shower Transfer - Progress: Met ADL Goal: Additional Goal #1 -  Progress: Progressing toward goals  Visit Information  Last OT Received On: 08/12/12 Assistance Needed: +1    Subjective Data  Subjective: Do you think they will let me take a shower? Patient Stated Goal: To get back to walking without a brace on.      Cognition  Overall Cognitive Status: Appears within functional limits for tasks assessed/performed Arousal/Alertness: Awake/alert Orientation Level: Appears intact for tasks assessed Behavior During Session: [facility] for tasks performed    Mobility  Shoulder Instructions Bed Mobility Bed Mobility: Rolling Right;Right Sidelying to Sit Rolling Right: 6: Modified independent (Device/Increase time);With rail Right Sidelying to Sit: 6: Modified independent (Device/Increase time);With rails Transfers Transfers: Sit to Stand Sit to Stand: 6: Modified independent (Device/Increase time);With upper extremity assist;From bed Stand to Sit: 6: Modified independent (Device/Increase time);Without upper extremity assist;To bed          Balance Balance Balance Assessed: Yes Dynamic Standing Balance Dynamic Standing - Balance Support: Right upper extremity supported;Left upper extremity supported Dynamic Standing - Level of Assistance: 6: Modified independent (Device/Increase time) Dynamic Standing - Comments: with use of RW for safety and mobility   End of Session OT - End of Session Equipment Utilized During Treatment: Back brace Activity Tolerance: Patient tolerated treatment well Patient left: in bed;with call bell/phone within reach Nurse Communication: Mobility status     Christhoper Busbee OTR/L Pager number [phone] 08/12/2012, 10:59 AM

## 2012-08-13 MED ORDER — OXYCODONE-ACETAMINOPHEN 5-325 MG PO TABS: 1.0000 | ORAL_TABLET | ORAL | Status: DC | PRN

## 2012-08-13 MED ORDER — METHOCARBAMOL 500 MG PO TABS: 500.0000 mg | ORAL_TABLET | Freq: Four times a day (QID) | ORAL | Status: DC | PRN

## 2012-08-13 NOTE — Discharge Summary (Signed)
Physician Discharge Summary  Patient ID: [name] MRN: [id number] DOB/AGE: [birth year] 37 y.o.  Admit date: 08/10/2012 Discharge date: 08/13/2012  Admission Diagnoses: Herniated nucleus pulposus L4-L5 left, recurrent lumbar radiculopathy  Discharge Diagnoses: Recurrent herniated nucleus pulposus L4-L5 left, lumbar radiculopathy Active Problems:  * No active hospital problems. *    Discharged Condition: good  Hospital Course: Patient was admitted having had evidence of a recurrent herniated nucleus pulposus at L4-L5 on the left side. She had severe pain that was unrelenting and she had a left foot drop. She underwent surgical decompression and during the recovery in the hospital she's had some return of function of her tibialis anterior and dorsi flexor on the left side. She has had great relief of pain. She is being sent home with modest pain medication at the current time.  Consults: None  Significant Diagnostic Studies: MRI lumbar spine  Treatments: surgery: Decompression of L4-L5 via total discectomy posterior lumbar interbody arthrodesis with peek spacers pedicle screw fixation L4-L5, posterior lateral arthrodesis with autograft and allograft.  Discharge Exam: Blood pressure 132/84, pulse 92, temperature 97.4 F (36.3 C), temperature source Oral, resp. rate 16, height 5\' 2"  (1.575 m), weight 60.8 kg (134 lb 0.6 oz), last menstrual period 07/31/2012, SpO2 100.00%. Tibialis anterior function on the left side is a 4/5 today incision is clean and dry. Station and gait are intact.  Disposition: 01-Home or Self Care  Discharge Orders    Future Orders Please Complete By Expires   Diet - low sodium heart healthy      Increase activity slowly      Discharge instructions      Comments:   Okay to shower. Do not apply salves or appointments to incision. No heavy lifting with the upper extremities greater than 15 pounds. May resume driving when not requiring pain medication and  patient feels comfortable with doing so.   Call MD for:  redness, tenderness, or signs of infection (pain, swelling, redness, odor or green/yellow discharge around incision site)      Call MD for:  temperature >100.4          Medication List     As of 08/13/2012  9:03 AM    TAKE these medications         DULoxetine 60 MG capsule   Commonly known as: CYMBALTA   Take 60 mg by mouth daily.      HYDROmorphone 2 MG tablet   Commonly known as: DILAUDID   Take 2 mg by mouth every 4 (four) hours as needed. For pain      ibuprofen 200 MG tablet   Commonly known as: ADVIL,MOTRIN   Take 200 mg by mouth every 6 (six) hours as needed. For pain      JUNEL 1.5/30 1.5-30 MG-MCG tablet   Generic drug: Norethindrone Acetate-Ethinyl Estradiol   Take 1 tablet by mouth daily.      methocarbamol 500 MG tablet   Commonly known as: ROBAXIN   Take 1 tablet (500 mg total) by mouth 4 (four) times daily as needed (Muscle spasm).      multivitamin with minerals Tabs   Take 1 tablet by mouth daily.      oxyCODONE-acetaminophen 5-325 MG per tablet   Commonly known as: PERCOCET/ROXICET   Take 1-2 tablets by mouth every 4 (four) hours as needed for pain.      ramipril 5 MG capsule   Commonly known as: ALTACE   Take 1 capsule (5 mg total) by  mouth daily.         SignedStefani Dama 08/13/2012, 9:03 AM

## 2012-08-13 NOTE — Plan of Care (Signed)
Problem: Phase III Progression Outcomes Goal: Pain controlled on oral analgesia Outcome: Completed/Met Date Met:  08/13/12 Patient finds taken pain medication ATC to be most effective for pain relief. Goal: Activity at appropriate level-compared to baseline (UP IN CHAIR FOR HEMODIALYSIS)  Outcome: Completed/Met Date Met:  08/13/12 Patient able to be up and ambulating with brace and walker per self.  Problem: Discharge Progression Outcomes Goal: Discharge plan in place and appropriate Outcome: Completed/Met Date Met:  08/13/12 Reviewed discharge instructions with patient. Goal: Hemodynamically stable Outcome: Completed/Met Date Met:  08/13/12 Vitals have remained stable for duration of hospitalization. Goal: Tolerates diet Outcome: Completed/Met Date Met:  08/13/12 Tolerates diet without c/o nausea or vomiting. Goal: Incision without S/S infection Outcome: Completed/Met Date Met:  08/13/12 Incision line free from redness and drainage. Goal: Demonstrates proper use of assistive devices Outcome: Completed/Met Date Met:  08/13/12 Patient able to don brace and utilize walker for ambulation.

## 2012-08-14 NOTE — Care Management Note (Signed)
    Page 1 of 1   08/14/2012     8:16:27 AM   CARE MANAGEMENT NOTE 08/14/2012  Patient:  [name], [name]   Account Number:  [id number]  Date Initiated:  08/11/2012  Documentation initiated by:  [facility]  Subjective/Objective Assessment:   admitted postop L4-5 PLIF     Action/Plan:   PT/OT evals- no follow up needed   Anticipated DC Date:  08/14/2012   Anticipated DC Plan:  HOME/SELF CARE      DC Planning Services  CM consult      Choice offered to / List presented to:             Status of service:  Completed, signed off Medicare Important Message given?   (If response is "NO", the following Medicare IM given date fields will be blank) Date Medicare IM given:   Date Additional Medicare IM given:    Discharge Disposition:  HOME/SELF CARE  Per UR Regulation:  Reviewed for med. necessity/level of care/duration of stay  If discussed at Long Length of Stay Meetings, dates discussed:    Comments:

## 2012-08-16 MED FILL — Sodium Chloride IV Soln 0.9%: INTRAVENOUS | Qty: 2000 | Status: AC

## 2012-08-16 MED FILL — Heparin Sodium (Porcine) Inj 1000 Unit/ML: INTRAMUSCULAR | Qty: 30 | Status: AC

## 2012-09-13 ENCOUNTER — Emergency Department (HOSPITAL_COMMUNITY)
Admission: EM | Admit: 2012-09-13 | Discharge: 2012-09-13 | Discharge status: Absent without leave | Payer: BC Managed Care – PPO | Source: Home / Self Care

## 2012-11-04 ENCOUNTER — Emergency Department (HOSPITAL_COMMUNITY)
Admission: EM | Admit: 2012-11-04 | Discharge: 2012-11-04 | Disposition: A | Discharge status: Home / Self Care | Payer: BC Managed Care – PPO | Attending: Emergency Medicine | Admitting: Emergency Medicine

## 2012-11-04 ENCOUNTER — Encounter (HOSPITAL_COMMUNITY): Payer: Self-pay

## 2012-11-04 DIAGNOSIS — M545 Low back pain, unspecified: Secondary | ICD-10-CM | POA: Insufficient documentation

## 2012-11-04 DIAGNOSIS — Z9889 Other specified postprocedural states: Secondary | ICD-10-CM | POA: Insufficient documentation

## 2012-11-04 DIAGNOSIS — Z8781 Personal history of (healed) traumatic fracture: Secondary | ICD-10-CM | POA: Insufficient documentation

## 2012-11-04 DIAGNOSIS — Z79899 Other long term (current) drug therapy: Secondary | ICD-10-CM | POA: Insufficient documentation

## 2012-11-04 DIAGNOSIS — I10 Essential (primary) hypertension: Secondary | ICD-10-CM | POA: Insufficient documentation

## 2012-11-04 DIAGNOSIS — F17200 Nicotine dependence, unspecified, uncomplicated: Secondary | ICD-10-CM | POA: Insufficient documentation

## 2012-11-04 MED ORDER — CYCLOBENZAPRINE HCL 10 MG PO TABS: 10.0000 mg | ORAL_TABLET | Freq: Two times a day (BID) | ORAL | Status: DC | PRN

## 2012-11-04 MED ORDER — OXYCODONE-ACETAMINOPHEN 5-325 MG PO TABS: 1.0000 | ORAL_TABLET | ORAL | Status: DC | PRN

## 2012-11-04 NOTE — ED Notes (Signed)
Pt had spinal fusion 08/10/2012.  At that time pain was primarily on the left.  Now pt c/o back pain radiating down right leg.

## 2012-11-04 NOTE — ED Provider Notes (Signed)
Medical screening examination/treatment/procedure(s) were performed by non-physician practitioner and as supervising physician I was immediately available for consultation/collaboration.   Samin Milke B. Desani Sprung, MD 11/04/12 1543 

## 2012-11-04 NOTE — ED Provider Notes (Signed)
History     CSN: [id number]  Arrival date & time 11/04/12  1116   First MD Initiated Contact with Patient 11/04/12 1239      Chief Complaint  Patient presents with  . Back Pain    (Consider location/radiation/quality/duration/timing/severity/associated sxs/prior treatment) Patient is a 37 y.o. female presenting with back pain. The history is provided by the patient.  Back Pain Location:  Lumbar spine Quality:  Stabbing and shooting Radiates to:  R thigh Pain severity:  Severe Associated symptoms: no fever   Associated symptoms comment:  She reports having discectomy in January of this year and for the past 7 days, she has had recurrent back pain similar to what she experienced prior to her surgery. No injury. Pain is radiating to the right leg now, where previously it radiated to the left. No urinary or bowel incontinence.   Past Medical History  Diagnosis Date  . Hypertension   . Compression fracture of lumbar vertebra     Past Surgical History  Procedure Laterality Date  . Back surgery    . Back surgery  07/2012    lumbar spinal fusion L4 Danielle Dess, MD    History reviewed. No pertinent family history.  History  Substance Use Topics  . Smoking status: Current Every Day Smoker -- 0.25 packs/day for 15 years    Types: Cigarettes  . Smokeless tobacco: Never Used  . Alcohol Use: Yes     Comment: rarely    OB History   Grav Para Term Preterm Abortions TAB SAB Ect Mult Living   2 2 2  0 0 0 0 0 0 2      Review of Systems  Constitutional: Negative for fever and chills.  Gastrointestinal: Negative.   Musculoskeletal: Positive for back pain.  Skin: Negative.   Neurological: Negative.     Allergies  Review of patient's allergies indicates no known allergies.  Home Medications   Current Outpatient Rx  Name  Route  Sig  Dispense  Refill  . DULoxetine (CYMBALTA) 60 MG capsule   Oral   Take 60 mg by mouth daily.         . Multiple Vitamin (MULTIVITAMIN  WITH MINERALS) TABS   Oral   Take 1 tablet by mouth daily.         . Naproxen Sodium (ALEVE PO)   Oral   Take 1 capsule by mouth every 8 (eight) hours as needed (pain).         . Norethindrone Acetate-Ethinyl Estradiol (JUNEL 1.5/30) 1.5-30 MG-MCG tablet   Oral   Take 1 tablet by mouth daily.         . ramipril (ALTACE) 5 MG capsule   Oral   Take 1 capsule (5 mg total) by mouth daily.   90 capsule   3     BP 159/112  Pulse 110  Temp(Src) 98.9 F (37.2 C) (Oral)  Resp 18  SpO2 100%  LMP 10/21/2012  Physical Exam  Constitutional: She is oriented to person, place, and time. She appears well-developed and well-nourished.  HENT:  Head: Normocephalic.  Neck: Normal range of motion. Neck supple.  Cardiovascular: Normal rate and regular rhythm.   Pulmonary/Chest: Effort normal and breath sounds normal.  Abdominal: Soft. Bowel sounds are normal. There is no tenderness. There is no rebound and no guarding.  Musculoskeletal: Normal range of motion.  Mild lumbar and paralumbar tenderness without swelling or discoloration.  Neurological: She is alert and oriented to person, place, and time. She  has normal reflexes. Coordination normal.  Skin: Skin is warm and dry. No rash noted.  Psychiatric: She has a normal mood and affect.    ED Course  Procedures (including critical care time)  Labs Reviewed - No data to display No results found.   No diagnosis found.  1. Lower back pain  MDM  Lower back pain without evidence of neurologic deficit.         Arnoldo Hooker, PA-C 11/04/12 1328

## 2012-11-18 ENCOUNTER — Encounter (HOSPITAL_COMMUNITY): Payer: Self-pay | Admitting: Emergency Medicine

## 2012-11-18 ENCOUNTER — Emergency Department (HOSPITAL_COMMUNITY)
Admission: EM | Admit: 2012-11-18 | Discharge: 2012-11-18 | Disposition: A | Discharge status: Home / Self Care | Payer: BC Managed Care – PPO | Attending: Emergency Medicine | Admitting: Emergency Medicine

## 2012-11-18 DIAGNOSIS — Z3202 Encounter for pregnancy test, result negative: Secondary | ICD-10-CM | POA: Insufficient documentation

## 2012-11-18 DIAGNOSIS — R109 Unspecified abdominal pain: Secondary | ICD-10-CM | POA: Insufficient documentation

## 2012-11-18 DIAGNOSIS — M545 Low back pain, unspecified: Secondary | ICD-10-CM | POA: Insufficient documentation

## 2012-11-18 DIAGNOSIS — I10 Essential (primary) hypertension: Secondary | ICD-10-CM | POA: Insufficient documentation

## 2012-11-18 DIAGNOSIS — Z79899 Other long term (current) drug therapy: Secondary | ICD-10-CM | POA: Insufficient documentation

## 2012-11-18 DIAGNOSIS — Z8739 Personal history of other diseases of the musculoskeletal system and connective tissue: Secondary | ICD-10-CM | POA: Insufficient documentation

## 2012-11-18 DIAGNOSIS — F17200 Nicotine dependence, unspecified, uncomplicated: Secondary | ICD-10-CM | POA: Insufficient documentation

## 2012-11-18 LAB — URINE MICROSCOPIC-ADD ON

## 2012-11-18 LAB — URINALYSIS, ROUTINE W REFLEX MICROSCOPIC
Bilirubin Urine: NEGATIVE
Glucose, UA: NEGATIVE mg/dL
Ketones, ur: NEGATIVE mg/dL
Leukocytes, UA: NEGATIVE
pH: 5.5 (ref 5.0–8.0)

## 2012-11-18 LAB — CBC WITH DIFFERENTIAL/PLATELET
Basophils Absolute: 0 10*3/uL (ref 0.0–0.1)
Basophils Relative: 0 % (ref 0–1)
Hemoglobin: 13.5 g/dL (ref 12.0–15.0)
MCHC: 33.5 g/dL (ref 30.0–36.0)
Neutro Abs: 10.1 10*3/uL — ABNORMAL HIGH (ref 1.7–7.7)
Neutrophils Relative %: 69 % (ref 43–77)
RDW: 13.7 % (ref 11.5–15.5)

## 2012-11-18 LAB — COMPREHENSIVE METABOLIC PANEL
AST: 16 U/L (ref 0–37)
Albumin: 4.1 g/dL (ref 3.5–5.2)
Alkaline Phosphatase: 58 U/L (ref 39–117)
Chloride: 106 mEq/L (ref 96–112)
Potassium: 3.6 mEq/L (ref 3.5–5.1)
Total Bilirubin: 0.4 mg/dL (ref 0.3–1.2)

## 2012-11-18 LAB — POCT PREGNANCY, URINE: Preg Test, Ur: NEGATIVE

## 2012-11-18 MED ORDER — OXYCODONE-ACETAMINOPHEN 5-325 MG PO TABS: 2.0000 | ORAL_TABLET | ORAL | Status: DC | PRN

## 2012-11-18 MED ORDER — ONDANSETRON HCL 4 MG/2ML IJ SOLN
4.0000 mg | Freq: Once | INTRAMUSCULAR | Status: AC
  Administered 2012-11-18: 4 mg via INTRAVENOUS
  Filled 2012-11-18: qty 2

## 2012-11-18 MED ORDER — MORPHINE SULFATE 4 MG/ML IJ SOLN
4.0000 mg | Freq: Once | INTRAMUSCULAR | Status: AC
  Administered 2012-11-18: 4 mg via INTRAVENOUS
  Filled 2012-11-18: qty 1

## 2012-11-18 NOTE — ED Provider Notes (Signed)
History     CSN: [id number]  Arrival date & time 11/18/12  1135   First MD Initiated Contact with Patient 11/18/12 1201      Chief Complaint  Patient presents with  . Abdominal Pain  . Back Pain    (Consider location/radiation/quality/duration/timing/severity/associated sxs/prior treatment) HPI Comments: Patient with history of spinal fusion three months ago.  She was doing well in the immediate post-operative course.  Over the past few weeks she has started to have severe pain again.  She denies any injury or trauma.  No chills but does report having a fever off and on over the past few weeks.  No urinary complaints.  No bowel or bladder incontinence.    Patient is a 37 y.o. female presenting with back pain. The history is provided by the patient.  Back Pain Location:  Lumbar spine Quality:  Stabbing Radiates to:  Does not radiate Pain severity:  Moderate Pain is:  Same all the time Timing:  Constant Progression:  Worsening Chronicity:  Recurrent Relieved by:  Nothing Worsened by:  Movement, standing and bending Ineffective treatments:  None tried   Past Medical History  Diagnosis Date  . Hypertension   . Compression fracture of lumbar vertebra     Past Surgical History  Procedure Laterality Date  . Back surgery    . Back surgery  07/2012    lumbar spinal fusion L4 Danielle Dess, MD    History reviewed. No pertinent family history.  History  Substance Use Topics  . Smoking status: Current Every Day Smoker -- 0.25 packs/day for 15 years    Types: Cigarettes  . Smokeless tobacco: Never Used  . Alcohol Use: Yes     Comment: rarely    OB History   Grav Para Term Preterm Abortions TAB SAB Ect Mult Living   2 2 2  0 0 0 0 0 0 2      Review of Systems  Musculoskeletal: Positive for back pain.  All other systems reviewed and are negative.    Allergies  Review of patient's allergies indicates no known allergies.  Home Medications   Current Outpatient Rx   Name  Route  Sig  Dispense  Refill  . cyclobenzaprine (FLEXERIL) 10 MG tablet   Oral   Take 1 tablet (10 mg total) by mouth 2 (two) times daily as needed for muscle spasms.   20 tablet   0   . DULoxetine (CYMBALTA) 60 MG capsule   Oral   Take 60 mg by mouth daily.         . methocarbamol (ROBAXIN) 500 MG tablet   Oral   Take 500 mg by mouth 4 (four) times daily.         . Multiple Vitamin (MULTIVITAMIN WITH MINERALS) TABS   Oral   Take 1 tablet by mouth daily.         . Naproxen Sodium (ALEVE PO)   Oral   Take 1 capsule by mouth every 8 (eight) hours as needed (pain).         . Norethindrone Acetate-Ethinyl Estradiol (JUNEL 1.5/30) 1.5-30 MG-MCG tablet   Oral   Take 1 tablet by mouth daily.         Marland Kitchen oxyCODONE-acetaminophen (PERCOCET/ROXICET) 5-325 MG per tablet   Oral   Take 1 tablet by mouth every 4 (four) hours as needed for pain.   15 tablet   0   . ramipril (ALTACE) 5 MG capsule   Oral   Take 1  capsule (5 mg total) by mouth daily.   90 capsule   3     BP 152/96  Pulse 105  Temp(Src) 98.7 F (37.1 C) (Oral)  Resp 17  Ht 5\' 2"  (1.575 m)  Wt 132 lb 6 oz (60.045 kg)  BMI 24.21 kg/m2  SpO2 100%  LMP 11/12/2012  Physical Exam  Nursing note and vitals reviewed. Constitutional: She is oriented to person, place, and time. She appears well-developed and well-nourished. No distress.  HENT:  Head: Normocephalic and atraumatic.  Neck: Normal range of motion. Neck supple.  Cardiovascular: Normal rate and regular rhythm.  Exam reveals no gallop and no friction rub.   No murmur heard. Pulmonary/Chest: Effort normal and breath sounds normal. No respiratory distress. She has no wheezes.  Abdominal: Soft. Bowel sounds are normal. She exhibits no distension. There is no tenderness.  Musculoskeletal: Normal range of motion.  Neurological: She is alert and oriented to person, place, and time.  The strength is 5/5 in the ble,  The DTR's are equal in the ble   She is ambulatory without difficulty.  Skin: Skin is warm and dry. She is not diaphoretic.    ED Course  Procedures (including critical care time)  Labs Reviewed  CBC WITH DIFFERENTIAL - Abnormal; Notable for the following:    WBC 14.7 (*)    Neutro Abs 10.1 (*)    All other components within normal limits  COMPREHENSIVE METABOLIC PANEL - Abnormal; Notable for the following:    Glucose, Bld 100 (*)    All other components within normal limits  URINALYSIS, ROUTINE W REFLEX MICROSCOPIC - Abnormal; Notable for the following:    Specific Gravity, Urine 1.031 (*)    Hgb urine dipstick MODERATE (*)    All other components within normal limits  LIPASE, BLOOD  SEDIMENTATION RATE  URINE MICROSCOPIC-ADD ON  C-REACTIVE PROTEIN  POCT PREGNANCY, URINE   No results found.   No diagnosis found.    MDM  The patient presents with complaints of back pain that is recurring after her surgery.  She is neurologically intact with no bowel or bladder complaints.  She is feeling better with the meds given and I believe is stable for discharge.  I have spoken with Dr. Phoebe Perch who does not feel as though mri is indicated emergently, but recommends she get back with Dr. Danielle Dess this week.    The patient has expressed frustration with getting an appointment with Dr. Verlee Rossetti office.  I have advised her to inform the staff that she was seen in the ED and we feel as though urgent follow up is appropriate.          Geoffery Lyons, MD 11/18/12 [phone]

## 2012-11-18 NOTE — ED Notes (Signed)
Patient has a spinal fusion on the 21st of January. The patient reports that she has had pain to her lower back and pain to her abdominal pain. The patient also reports N/V/D frequently

## 2012-11-23 ENCOUNTER — Other Ambulatory Visit: Payer: Self-pay | Admitting: Neurological Surgery

## 2012-11-23 DIAGNOSIS — M5416 Radiculopathy, lumbar region: Secondary | ICD-10-CM

## 2012-11-24 ENCOUNTER — Ambulatory Visit
Admission: RE | Admit: 2012-11-24 | Discharge: 2012-11-24 | Disposition: A | Discharge status: Home / Self Care | Payer: BC Managed Care – PPO | Source: Ambulatory Visit | Attending: Neurological Surgery | Admitting: Neurological Surgery

## 2012-11-24 DIAGNOSIS — M5416 Radiculopathy, lumbar region: Secondary | ICD-10-CM

## 2012-12-12 ENCOUNTER — Telehealth: Payer: Self-pay | Admitting: Internal Medicine

## 2012-12-12 MED ORDER — RAMIPRIL 5 MG PO CAPS: 5.0000 mg | ORAL_CAPSULE | Freq: Every day | ORAL | Status: DC

## 2012-12-12 NOTE — Telephone Encounter (Signed)
Left detailed message Rx sent to pharmacy as requested. 

## 2012-12-12 NOTE — Telephone Encounter (Signed)
PT called to request a refill of her ramipril (ALTACE) capsule 5 mg, she will run out before her FU next week. She requested that it be sent to [facility]. Please assist.

## 2012-12-20 ENCOUNTER — Ambulatory Visit (INDEPENDENT_AMBULATORY_CARE_PROVIDER_SITE_OTHER): Payer: BC Managed Care – PPO | Admitting: Internal Medicine

## 2012-12-20 ENCOUNTER — Encounter: Payer: Self-pay | Admitting: Internal Medicine

## 2012-12-20 VITALS — BP 156/100 | HR 103 | Temp 98.9°F | Resp 20 | Wt 133.0 lb

## 2012-12-20 DIAGNOSIS — I10 Essential (primary) hypertension: Secondary | ICD-10-CM

## 2012-12-20 MED ORDER — RAMIPRIL 10 MG PO CAPS: 10.0000 mg | ORAL_CAPSULE | Freq: Every day | ORAL | Status: DC

## 2012-12-20 MED ORDER — HYDROCHLOROTHIAZIDE 25 MG PO TABS: 25.0000 mg | ORAL_TABLET | Freq: Every day | ORAL | Status: DC

## 2012-12-20 MED ORDER — RAMIPRIL 5 MG PO CAPS: 5.0000 mg | ORAL_CAPSULE | Freq: Every day | ORAL | Status: DC

## 2012-12-20 MED ORDER — HYDROCODONE-ACETAMINOPHEN 5-300 MG PO TABS: ORAL_TABLET | ORAL | Status: DC

## 2012-12-20 NOTE — Patient Instructions (Signed)
Limit your sodium (Salt) intake    It is important that you exercise regularly, at least 20 minutes 3 to 4 times per week.  If you develop chest pain or shortness of breath seek  medical attention.  Smoking tobacco is very bad for your health. You should stop smoking immediately.  Please check your blood pressure on a regular basis.  If it is consistently greater than 150/90, please make an office appointment.  

## 2012-12-20 NOTE — Progress Notes (Signed)
Subjective:    Patient ID: [name], female    DOB: [birth year], 37 y.o.   MRN: [id number]  HPI   37 year old patient who has a history of treated hypertension. She has been on ramipril 5 mg and blood pressure has been consistently elevated. She has had some significant back pain and is followed closely by neurosurgery. She feels there is a relationship between her pain level and blood pressure readings. She has a 93-year-old daughter but has no further pregnancies planned in the future. She does use birth control pills.  Past Medical History  Diagnosis Date  . Hypertension   . Compression fracture of lumbar vertebra     History   Social History  . Marital Status: Married    Spouse Name: N/A    Number of Children: N/A  . Years of Education: N/A   Occupational History  . Not on file.   Social History Main Topics  . Smoking status: Current Every Day Smoker -- 0.25 packs/day for 15 years    Types: Cigarettes  . Smokeless tobacco: Never Used  . Alcohol Use: Yes     Comment: rarely  . Drug Use: No  . Sexually Active: Yes   Other Topics Concern  . Not on file   Social History Narrative  . No narrative on file    Past Surgical History  Procedure Laterality Date  . Back surgery    . Back surgery  07/2012    lumbar spinal fusion L4 Danielle Dess, MD    No family history on file.  No Known Allergies  Current Outpatient Prescriptions on File Prior to Visit  Medication Sig Dispense Refill  . DULoxetine (CYMBALTA) 60 MG capsule Take 60 mg by mouth daily.      . methocarbamol (ROBAXIN) 500 MG tablet Take 500 mg by mouth 4 (four) times daily.      . Multiple Vitamin (MULTIVITAMIN WITH MINERALS) TABS Take 1 tablet by mouth daily.      . Naproxen Sodium (ALEVE PO) Take 1 capsule by mouth every 8 (eight) hours as needed (pain).      . Norethindrone Acetate-Ethinyl Estradiol (JUNEL 1.5/30) 1.5-30 MG-MCG tablet Take 1 tablet by mouth daily.       No current  facility-administered medications on file prior to visit.    BP 156/100  Pulse 103  Temp(Src) 98.9 F (37.2 C) (Oral)  Resp 20  Wt 133 lb (60.328 kg)  BMI 24.32 kg/m2  SpO2 99%  LMP 12/10/2012  Breastfeeding? No       Review of Systems  Constitutional: Negative.   HENT: Negative for hearing loss, congestion, sore throat, rhinorrhea, dental problem, sinus pressure and tinnitus.   Eyes: Negative for pain, discharge and visual disturbance.  Respiratory: Negative for cough and shortness of breath.   Cardiovascular: Negative for chest pain, palpitations and leg swelling.  Gastrointestinal: Negative for nausea, vomiting, abdominal pain, diarrhea, constipation, blood in stool and abdominal distention.  Genitourinary: Negative for dysuria, urgency, frequency, hematuria, flank pain, vaginal bleeding, vaginal discharge, difficulty urinating, vaginal pain and pelvic pain.  Musculoskeletal: Positive for back pain. Negative for joint swelling, arthralgias and gait problem.  Skin: Negative for rash.  Neurological: Negative for dizziness, syncope, speech difficulty, weakness, numbness and headaches.  Hematological: Negative for adenopathy.  Psychiatric/Behavioral: Negative for behavioral problems, dysphoric mood and agitation. The patient is not nervous/anxious.        Objective:   Physical Exam  Constitutional: She is oriented to person, place, and  time. She appears well-developed and well-nourished.  Blood pressure 150/96  HENT:  Head: Normocephalic.  Right Ear: External ear normal.  Left Ear: External ear normal.  Mouth/Throat: Oropharynx is clear and moist.  Eyes: Conjunctivae and EOM are normal. Pupils are equal, round, and reactive to light.  Neck: Normal range of motion. Neck supple. No thyromegaly present.  Cardiovascular: Normal rate, regular rhythm, normal heart sounds and intact distal pulses.   Pulmonary/Chest: Effort normal and breath sounds normal.  Abdominal: Soft.  Bowel sounds are normal. She exhibits no mass. There is no tenderness.  Musculoskeletal: Normal range of motion.  Lymphadenopathy:    She has no cervical adenopathy.  Neurological: She is alert and oriented to person, place, and time.  Skin: Skin is warm and dry. No rash noted.  Psychiatric: She has a normal mood and affect. Her behavior is normal.          Assessment & Plan:   Hypertension. Suboptimal control. We'll increase Altace to 10 mg daily and add hydrochlorothiazide 25 mg daily Recheck 6 weeks

## 2013-02-20 ENCOUNTER — Ambulatory Visit (INDEPENDENT_AMBULATORY_CARE_PROVIDER_SITE_OTHER): Payer: BC Managed Care – PPO | Admitting: Internal Medicine

## 2013-02-20 ENCOUNTER — Encounter: Payer: Self-pay | Admitting: Internal Medicine

## 2013-02-20 VITALS — BP 140/100 | HR 115 | Temp 98.5°F | Resp 20 | Wt 129.0 lb

## 2013-02-20 DIAGNOSIS — I10 Essential (primary) hypertension: Secondary | ICD-10-CM

## 2013-02-20 MED ORDER — AMLODIPINE BESYLATE 5 MG PO TABS: 5.0000 mg | ORAL_TABLET | Freq: Every day | ORAL | Status: DC

## 2013-02-20 NOTE — Progress Notes (Signed)
  Subjective:    Patient ID: [name], female    DOB: 03/21/1976, 37 y.o.   MRN: [id number]  HPI  37 year old patient who is seen today for followup of hypertension. She is on combination therapy and blood pressures are still running consistently high. She's had difficulty with the severely symptomatic low back pain and is contemplating additional surgery. She has been under in  considerable pain and stress  BP Readings from Last 3 Encounters:  02/20/13 140/100  12/20/12 156/100  11/18/12 152/96    Review of Systems  Musculoskeletal: Positive for back pain.       Objective:   Physical Exam  Constitutional:  150/100          Assessment & Plan:   Hypertension suboptimal control. We'll continue hydrochlorothiazide and trandolapril. Will add amlodipine 5 Recheck 4 weeks

## 2013-02-20 NOTE — Patient Instructions (Signed)
Limit your sodium (Salt) intake  Please check your blood pressure on a regular basis.  If it is consistently greater than 150/90, please make an office appointment.  Return in one month for follow-up  

## 2013-03-07 ENCOUNTER — Encounter (HOSPITAL_COMMUNITY): Payer: Self-pay | Admitting: *Deleted

## 2013-03-07 ENCOUNTER — Emergency Department (HOSPITAL_COMMUNITY)
Admission: EM | Admit: 2013-03-07 | Discharge: 2013-03-08 | Discharge status: Against Medical Advice | Payer: BC Managed Care – PPO | Attending: Emergency Medicine | Admitting: Emergency Medicine

## 2013-03-07 DIAGNOSIS — I959 Hypotension, unspecified: Secondary | ICD-10-CM | POA: Insufficient documentation

## 2013-03-07 DIAGNOSIS — R51 Headache: Secondary | ICD-10-CM | POA: Insufficient documentation

## 2013-03-07 DIAGNOSIS — F17200 Nicotine dependence, unspecified, uncomplicated: Secondary | ICD-10-CM | POA: Insufficient documentation

## 2013-03-07 DIAGNOSIS — I10 Essential (primary) hypertension: Secondary | ICD-10-CM | POA: Insufficient documentation

## 2013-03-07 DIAGNOSIS — R55 Syncope and collapse: Secondary | ICD-10-CM | POA: Insufficient documentation

## 2013-03-07 NOTE — ED Notes (Signed)
Called patient 4 times to take to exam room.  No answer.

## 2013-03-07 NOTE — ED Notes (Addendum)
Pt reports hypotension tonight at home that caused a syncopal episode-called her PCP who advised her to come here for eval.  Reports a HA that started 2 days.  Pt ambulatory.  No distress noted.

## 2013-03-08 ENCOUNTER — Encounter (HOSPITAL_COMMUNITY): Payer: Self-pay | Admitting: Emergency Medicine

## 2013-03-08 ENCOUNTER — Telehealth: Payer: Self-pay | Admitting: Internal Medicine

## 2013-03-08 ENCOUNTER — Emergency Department (HOSPITAL_COMMUNITY): Payer: BC Managed Care – PPO

## 2013-03-08 ENCOUNTER — Emergency Department (HOSPITAL_COMMUNITY)
Admission: EM | Admit: 2013-03-08 | Discharge: 2013-03-08 | Disposition: A | Discharge status: Home / Self Care | Payer: BC Managed Care – PPO | Attending: Emergency Medicine | Admitting: Emergency Medicine

## 2013-03-08 DIAGNOSIS — F411 Generalized anxiety disorder: Secondary | ICD-10-CM | POA: Insufficient documentation

## 2013-03-08 DIAGNOSIS — R51 Headache: Secondary | ICD-10-CM | POA: Insufficient documentation

## 2013-03-08 DIAGNOSIS — Z3202 Encounter for pregnancy test, result negative: Secondary | ICD-10-CM | POA: Insufficient documentation

## 2013-03-08 DIAGNOSIS — R05 Cough: Secondary | ICD-10-CM | POA: Insufficient documentation

## 2013-03-08 DIAGNOSIS — R55 Syncope and collapse: Secondary | ICD-10-CM | POA: Diagnosis present

## 2013-03-08 DIAGNOSIS — Z8781 Personal history of (healed) traumatic fracture: Secondary | ICD-10-CM | POA: Insufficient documentation

## 2013-03-08 DIAGNOSIS — I10 Essential (primary) hypertension: Secondary | ICD-10-CM | POA: Insufficient documentation

## 2013-03-08 DIAGNOSIS — F17200 Nicotine dependence, unspecified, uncomplicated: Secondary | ICD-10-CM | POA: Insufficient documentation

## 2013-03-08 DIAGNOSIS — R519 Headache, unspecified: Secondary | ICD-10-CM | POA: Diagnosis present

## 2013-03-08 DIAGNOSIS — J3489 Other specified disorders of nose and nasal sinuses: Secondary | ICD-10-CM | POA: Insufficient documentation

## 2013-03-08 DIAGNOSIS — R059 Cough, unspecified: Secondary | ICD-10-CM | POA: Insufficient documentation

## 2013-03-08 DIAGNOSIS — Z79899 Other long term (current) drug therapy: Secondary | ICD-10-CM | POA: Insufficient documentation

## 2013-03-08 LAB — COMPREHENSIVE METABOLIC PANEL
ALT: 12 U/L (ref 0–35)
AST: 17 U/L (ref 0–37)
Albumin: 4 g/dL (ref 3.5–5.2)
CO2: 21 mEq/L (ref 19–32)
Calcium: 9.1 mg/dL (ref 8.4–10.5)
Sodium: 135 mEq/L (ref 135–145)
Total Protein: 7.1 g/dL (ref 6.0–8.3)

## 2013-03-08 LAB — CBC WITH DIFFERENTIAL/PLATELET
Basophils Absolute: 0 10*3/uL (ref 0.0–0.1)
Basophils Relative: 0 % (ref 0–1)
Eosinophils Relative: 2 % (ref 0–5)
Lymphocytes Relative: 22 % (ref 12–46)
MCHC: 33.6 g/dL (ref 30.0–36.0)
MCV: 90.7 fL (ref 78.0–100.0)
Platelets: 329 10*3/uL (ref 150–400)
RDW: 13.6 % (ref 11.5–15.5)
WBC: 18.4 10*3/uL — ABNORMAL HIGH (ref 4.0–10.5)

## 2013-03-08 LAB — URINALYSIS, ROUTINE W REFLEX MICROSCOPIC
Ketones, ur: NEGATIVE mg/dL
Leukocytes, UA: NEGATIVE
Nitrite: NEGATIVE
Protein, ur: NEGATIVE mg/dL
Urobilinogen, UA: 0.2 mg/dL (ref 0.0–1.0)

## 2013-03-08 LAB — POCT PREGNANCY, URINE: Preg Test, Ur: NEGATIVE

## 2013-03-08 LAB — URINE MICROSCOPIC-ADD ON

## 2013-03-08 MED ORDER — SODIUM CHLORIDE 0.9 % IV BOLUS (SEPSIS)
1000.0000 mL | Freq: Once | INTRAVENOUS | Status: AC
  Administered 2013-03-08: 1000 mL via INTRAVENOUS

## 2013-03-08 MED ORDER — PROCHLORPERAZINE EDISYLATE 5 MG/ML IJ SOLN
5.0000 mg | Freq: Four times a day (QID) | INTRAMUSCULAR | Status: DC | PRN
  Administered 2013-03-08: 5 mg via INTRAVENOUS
  Filled 2013-03-08: qty 2

## 2013-03-08 MED ORDER — DIPHENHYDRAMINE HCL 50 MG/ML IJ SOLN
25.0000 mg | Freq: Once | INTRAMUSCULAR | Status: AC
  Administered 2013-03-08: 25 mg via INTRAVENOUS
  Filled 2013-03-08: qty 1

## 2013-03-08 NOTE — ED Notes (Signed)
Pt presenting to ed with c/o sent by pcp for syncopal episode and elevated blood pressure pt states she passed out yesterday pt with c/o lightheadedness and headache pressure pt denies chest pain at this time pt states positive nausea

## 2013-03-08 NOTE — ED Provider Notes (Signed)
CSN: [id number]     Arrival date & time 03/08/13  1153 History   First MD Initiated Contact with Patient 03/08/13 1226     Chief Complaint  Patient presents with  . syncopal episode    (Consider location/radiation/quality/duration/timing/severity/associated sxs/prior Treatment) Patient is a 37 y.o. female presenting with syncope. The history is provided by the patient.  Loss of Consciousness Episode history:  Single Most recent episode:  Yesterday Timing: once. Progression:  Resolved Chronicity:  New Context comment:  While standing Witnessed: yes   Relieved by:  Bed rest Worsened by:  Nothing tried Ineffective treatments:  None tried Associated symptoms: anxiety and headaches   Associated symptoms: no chest pain, no difficulty breathing, no dizziness, no fever, no focal weakness, no nausea, no shortness of breath and no vomiting     Past Medical History  Diagnosis Date  . Hypertension   . Compression fracture of lumbar vertebra    Past Surgical History  Procedure Laterality Date  . Back surgery    . Back surgery  07/2012    lumbar spinal fusion L4 Danielle Dess, MD   No family history on file. History  Substance Use Topics  . Smoking status: Current Every Day Smoker -- 0.25 packs/day for 15 years    Types: Cigarettes  . Smokeless tobacco: Never Used     Comment: 4-5 cigarettes a day  . Alcohol Use: No     Comment: rarely   OB History   Grav Para Term Preterm Abortions TAB SAB Ect Mult Living   2 2 2  0 0 0 0 0 0 2     Review of Systems  Constitutional: Negative for fever and fatigue.  HENT: Positive for congestion. Negative for drooling and neck pain.   Eyes: Negative for pain.  Respiratory: Positive for cough. Negative for shortness of breath.   Cardiovascular: Positive for syncope. Negative for chest pain.  Gastrointestinal: Negative for nausea, vomiting, abdominal pain and diarrhea.  Genitourinary: Negative for dysuria and hematuria.  Musculoskeletal: Negative  for back pain and gait problem.  Skin: Negative for color change.  Neurological: Positive for headaches. Negative for dizziness and focal weakness.  Hematological: Negative for adenopathy.  Psychiatric/Behavioral: Negative for behavioral problems.  All other systems reviewed and are negative.    Allergies  Review of patient's allergies indicates no known allergies.  Home Medications   Current Outpatient Rx  Name  Route  Sig  Dispense  Refill  . amLODipine (NORVASC) 5 MG tablet   Oral   Take 1 tablet (5 mg total) by mouth daily.   90 tablet   3   . DULoxetine (CYMBALTA) 60 MG capsule   Oral   Take 60 mg by mouth daily.         . hydrochlorothiazide (HYDRODIURIL) 25 MG tablet   Oral   Take 1 tablet (25 mg total) by mouth daily.   90 tablet   3   . levETIRAcetam (KEPPRA) 250 MG tablet   Oral   Take 250 mg by mouth every 12 (twelve) hours.         . Multiple Vitamin (MULTIVITAMIN WITH MINERALS) TABS   Oral   Take 1 tablet by mouth daily.         . naproxen sodium (ANAPROX) 220 MG tablet   Oral   Take 220 mg by mouth 2 (two) times daily as needed (for pain).         . Norethindrone Acetate-Ethinyl Estradiol (JUNEL 1.5/30) 1.5-30 MG-MCG tablet  Oral   Take 1 tablet by mouth daily.         . ramipril (ALTACE) 10 MG capsule   Oral   Take 1 capsule (10 mg total) by mouth daily.   90 capsule   3    BP 166/113  Pulse 109  Temp(Src) 99.1 F (37.3 C) (Oral)  Resp 20  SpO2 99%  LMP 02/25/2013 Physical Exam  Nursing note and vitals reviewed. Constitutional: She is oriented to person, place, and time. She appears well-developed and well-nourished.  HENT:  Head: Normocephalic.  Mouth/Throat: No oropharyngeal exudate.  Eyes: Conjunctivae and EOM are normal. Pupils are equal, round, and reactive to light.  Neck: Normal range of motion. Neck supple.  Cardiovascular: Normal rate, regular rhythm, normal heart sounds and intact distal pulses.  Exam reveals no  gallop and no friction rub.   No murmur heard. Pulmonary/Chest: Effort normal and breath sounds normal. No respiratory distress. She has no wheezes.  Abdominal: Soft. Bowel sounds are normal. There is no tenderness. There is no rebound and no guarding.  Musculoskeletal: Normal range of motion. She exhibits no edema and no tenderness.  Neurological: She is alert and oriented to person, place, and time. She has normal strength. No cranial nerve deficit or sensory deficit. She displays a negative Romberg sign. Coordination and gait normal.  Skin: Skin is warm and dry.  Psychiatric: She has a normal mood and affect. Her behavior is normal.    ED Course  Procedures (including critical care time) Labs Review Labs Reviewed  CBC WITH DIFFERENTIAL - Abnormal; Notable for the following:    WBC 18.4 (*)    Neutro Abs 13.1 (*)    All other components within normal limits  COMPREHENSIVE METABOLIC PANEL - Abnormal; Notable for the following:    Glucose, Bld 117 (*)    BUN 25 (*)    All other components within normal limits  URINALYSIS, ROUTINE W REFLEX MICROSCOPIC - Abnormal; Notable for the following:    Hgb urine dipstick MODERATE (*)    All other components within normal limits  URINE MICROSCOPIC-ADD ON - Abnormal; Notable for the following:    Squamous Epithelial / LPF FEW (*)    All other components within normal limits  POCT PREGNANCY, URINE   Imaging Review Dg Chest 2 View  03/08/2013   *RADIOLOGY REPORT*  Clinical Data: Syncope.  CHEST - 2 VIEW  Comparison: PA and lateral chest 08/10/2012.  Findings: Lungs are clear.  Heart size is normal.  No pneumothorax or pleural effusion.  No focal bony abnormality.  IMPRESSION: Negative chest.   Original Report Authenticated By: Holley Dexter, M.D.   Ct Head Wo Contrast  03/08/2013   *RADIOLOGY REPORT*  Clinical Data: Syncopal episode yesterday with loss of consciousness.  Acute hypertension.  Headache.  Nausea.  CT HEAD WITHOUT CONTRAST   Technique:  Contiguous axial images were obtained from the base of the skull through the vertex without contrast.  Comparison: None.  Findings: Ventricular system normal in size and appearance for age. No mass lesion.  No midline shift.  No acute hemorrhage or hematoma.  No extra-axial fluid collections.  No evidence of acute infarction.  Calcification in the choroid plexus in the right choroidal fissure.  No skull fracture or other focal osseous abnormality involving the skull.  Visualized paranasal sinuses, bilateral mastoid air cells, and bilateral middle ear cavities well-aerated.  IMPRESSION: Normal examination.   Original Report Authenticated By: Hulan Saas, M.D.     Date:  03/08/2013  Rate: 88  Rhythm: normal sinus rhythm  QRS Axis: normal  Intervals: normal  ST/T Wave abnormalities: normal  Conduction Disutrbances:none  Narrative Interpretation: No ST or T wave changes consistent with ischemia.    Old EKG Reviewed: unchanged   MDM   1. Syncope   2. Headache     12:47 PM 37 y.o. female pw syncopal episode while standing last night. No reported seizure like activity. Pt notes cough/cong/pressure HA x 3 days. AFVSS here, denies fever. Pt appears well on exam w/ very low suspicion for serious intracranial pathology such as SAH or meningitis. Will get Ct to r/o any mass lesion or injury assoc w/ fall.   Pt not orthostatic. I interpreted/reviewed the labs and/or imaging which were non-contributory. Pt's HA feeling better, she continues to appear well. Unsure of the cause of her syncope. Pt does not dec po intake w/ mild tachycardia on arrival. Possibly dehydrated.  I have discussed the diagnosis/risks/treatment options with the patient and believe the pt to be eligible for discharge home to follow-up with pcp to discuss holter monitor. We also discussed returning to the ED immediately if new or worsening sx occur. We discussed the sx which are most concerning (e.g., further syncope,  fever, worsening HA) that necessitate immediate return. Any new prescriptions provided to the patient are listed below.  Discharge Medication List as of 03/08/2013  2:46 PM         Randa Spike Mort Sawyers, MD 03/08/13 2010

## 2013-03-08 NOTE — Telephone Encounter (Signed)
Patient Information:  Caller Name: [name]  Phone: [phone]  Patient: [name], [name]  Gender: Female  DOB: 1976-05-06  Age: 37 Years  PCP: Eleonore Chiquito (Family Practice > 20yrs old)  Pregnant: No  Office Follow Up:  Does the office need to follow up with this patient?: Yes  Instructions For The Office: Please provide reinforcement for immediate ED evaluation since caller reluctant to return to ED  RN Note:  Declined to call 911; initially said will return to ED if symtoms worsen. Reluctant to leave 35 yo and 37 yo at home to seek treatment. Stressed need for immediate emergency evaluation/treatment for symptoms due to ongoing symptoms. RN refused to provide advice about medications since they are ordered by MD.   Overcame objections regarding emergent care and discusssed viable options for child care including calling husband to come home from work. Agreed to return to ED to complete evaluation.  Symptoms  Reason For Call & Symptoms:  Called to request BP medication be reduced since back pain is reduced. Had syncope episode 03/07/13 with loss of consciousness for 1 minute at 2000; struck head on floor;  No goose egg; just sore at point of contact. On 03/07/13, triage nurse advised call 911.  Husband drove her to  [facility] ED;  Had normal EKG but left against medical advice after 1.5-2 hours in ED because BP returned to normal and she had to get home to kids.  BP 160/108 in ED initially then 128/86 when left ED.  BP 100/80 right arm at 0945 03/08/13.  Continues to have mild lightheadedness but decreased "pressure, like a bowling ball in head"     Reviewed Health History In EMR: Yes  Reviewed Medications In EMR: Yes  Reviewed Allergies In EMR: Yes  Reviewed Surgeries / Procedures: Yes  Date of Onset of Symptoms: 03/07/2013  Treatments Tried: ED evaluation but left AMA before exam completed;  Did not take BP medication this morning.  Treatments Tried Worked: Yes OB / GYN:  LMP:  02/25/2013  Guideline(s) Used:  Fainting  Disposition Per Guideline:   Call EMS 911 Now  Reason For Disposition Reached:   Still feels dizzy or lightheaded  Advice Given:  N/A  Patient Refused Recommendation:  Patient Will Go To ED  [facility]

## 2013-03-08 NOTE — Telephone Encounter (Signed)
[facility] Triage Call Report Triage Record Num: [id number] Operator: Dustin Flock Dobson[place]Trail Patient Name: [name] Call Date & Time: 03/07/2013 8:56:20PM Patient Phone: PCP: Gordy Savers Patient Gender: Female PCP Fax : [phone] Patient DOB: August 24, 1975 [facility] Name: [facility] Reason for Call: Caller: [name]/Patient; PCP: Eleonore Chiquito (Family Practice > 8yrs old); CB#: [phone]; Call regarding Low BP, Onset 03/06/13. dizziness and pressure in head; Today 03/07/13 fainted ago. BP 98/66. Hit head. Is dizzy. 03/07/13 pressure in head and dizziness got worse. Emergent S&S identified per Head Injury Protocol: "unconscious now or within last hr or for more than at time of injury."Advised 911. Caller denied stating she will have someone drive her to [facility] ED. Protocol(s) Used: Head Injury Recommended Outcome per Protocol: Activate EMS 911 Reason for Outcome: Unconscious now or within last hour OR for more than 5 minutes at time of injury Care Advice: ~ Do not give the patient anything to eat or drink. ~ IMMEDIATE ACTION Write down provider's name. List or place the following in a bag for transport with the patient: current prescription and/or nonprescription medications; alternative treatments, therapies and medications; and street drugs. ~ An adult should stay with the patient, preferably one trained in CPR. If the person is not trained in CPR, then he or she should provide hands-only (compression-only) CPR as recommended by the American [employer] Association. ~ 08/

## 2013-08-08 ENCOUNTER — Encounter: Payer: Self-pay | Admitting: Family Medicine

## 2013-08-08 ENCOUNTER — Ambulatory Visit (INDEPENDENT_AMBULATORY_CARE_PROVIDER_SITE_OTHER): Payer: BC Managed Care – PPO | Admitting: Family Medicine

## 2013-08-08 VITALS — BP 122/90 | Temp 98.7°F | Wt 129.0 lb

## 2013-08-08 DIAGNOSIS — J029 Acute pharyngitis, unspecified: Secondary | ICD-10-CM

## 2013-08-08 DIAGNOSIS — J069 Acute upper respiratory infection, unspecified: Secondary | ICD-10-CM

## 2013-08-08 NOTE — Progress Notes (Signed)
Pre visit review using our clinic review tool, if applicable. No additional management support is needed unless otherwise documented below in the visit note. 

## 2013-08-08 NOTE — Patient Instructions (Signed)

## 2013-08-08 NOTE — Addendum Note (Signed)
Addended by: Azucena FreedMILLNER, Meklit Cotta C on: 08/08/2013 11:51 AM   Modules accepted: Orders

## 2013-08-08 NOTE — Progress Notes (Signed)
Chief Complaint  Patient presents with  . Fever    cough, congestion, sore throat, headcache, chills,     HPI:  -started: 3 days ago -symptoms:nasal congestion, sore throat, cough,  Chills, mild body aches, fever -denies:SOB, NVD, tooth pain -has tried: OTC meds -sick contacts/travel/risks: husband with similar symptoms, no travel - ebola risks, known flu or strep exposure   ROS: See pertinent positives and negatives per HPI.  Past Medical History  Diagnosis Date  . Hypertension   . Compression fracture of lumbar vertebra     Past Surgical History  Procedure Laterality Date  . Back surgery    . Back surgery  07/2012    lumbar spinal fusion L4 Danielle Dess, MD    No family history on file.  History   Social History  . Marital Status: Married    Spouse Name: N/A    Number of Children: N/A  . Years of Education: N/A   Social History Main Topics  . Smoking status: Current Every Day Smoker -- 0.25 packs/day for 15 years    Types: Cigarettes  . Smokeless tobacco: Never Used     Comment: 4-5 cigarettes a day  . Alcohol Use: No     Comment: rarely  . Drug Use: No  . Sexual Activity: Yes   Other Topics Concern  . None   Social History Narrative  . None    Current outpatient prescriptions:amLODipine (NORVASC) 5 MG tablet, Take 1 tablet (5 mg total) by mouth daily., Disp: 90 tablet, Rfl: 3;  DULoxetine (CYMBALTA) 60 MG capsule, Take 60 mg by mouth daily., Disp: , Rfl: ;  hydrochlorothiazide (HYDRODIURIL) 25 MG tablet, Take 1 tablet (25 mg total) by mouth daily., Disp: 90 tablet, Rfl: 3;  levETIRAcetam (KEPPRA) 250 MG tablet, Take 250 mg by mouth every 12 (twelve) hours., Disp: , Rfl:  Multiple Vitamin (MULTIVITAMIN WITH MINERALS) TABS, Take 1 tablet by mouth daily., Disp: , Rfl: ;  naproxen sodium (ANAPROX) 220 MG tablet, Take 220 mg by mouth 2 (two) times daily as needed (for pain)., Disp: , Rfl: ;  Norethindrone Acetate-Ethinyl Estradiol (JUNEL 1.5/30) 1.5-30 MG-MCG  tablet, Take 1 tablet by mouth daily., Disp: , Rfl: ;  ramipril (ALTACE) 10 MG capsule, Take 1 capsule (10 mg total) by mouth daily., Disp: 90 capsule, Rfl: 3  EXAM:  Filed Vitals:   08/08/13 1103  BP: 122/90  Temp: 98.7 F (37.1 C)    Body mass index is 23.59 kg/(m^2).  GENERAL: vitals reviewed and listed above, alert, oriented, appears well hydrated and in no acute distress  HEENT: atraumatic, conjunttiva clear, no obvious abnormalities on inspection of external nose and ears, normal appearance of ear canals and TMs, clear nasal congestion, mild post oropharyngeal erythema with PND, no tonsillar edema or exudate, no sinus TTP  NECK: no obvious masses on inspection  LUNGS: clear to auscultation bilaterally, no wheezes, rales or rhonchi, good air movement  CV: HRRR, no peripheral edema  MS: moves all extremities without noticeable abnormality  PSYCH: pleasant and cooperative, no obvious depression or anxiety  ASSESSMENT AND PLAN:  Discussed the following assessment and plan:  No diagnosis found.  -given HPI and exam findings today, a serious infection or illness is unlikely. We discussed potential etiologies, with VURI being most likely, and advised supportive care and monitoring. We discussed treatment side effects, likely course, antibiotic misuse, transmission, and signs of developing a serious illness. -discussed possibility of flu, but not in high risk group and out of  treatment window for likely benefit from tamiflu so opted not to test for this -she is worried about strep given the sore throat so while hx and exam do not suggest this will test to make sure -of course, we advised to return or notify a doctor immediately if symptoms worsen or persist or new concerns arise.    There are no Patient Instructions on file for this visit.   Kriste BasqueKIM, HANNAH R.

## 2013-08-09 ENCOUNTER — Telehealth: Payer: Self-pay | Admitting: Internal Medicine

## 2013-08-09 NOTE — Telephone Encounter (Signed)
Relevant patient education mailed to patient.  

## 2013-08-11 LAB — CULTURE, GROUP A STREP

## 2013-08-13 MED ORDER — AMOXICILLIN 875 MG PO TABS: 875.0000 mg | ORAL_TABLET | Freq: Two times a day (BID) | ORAL | Status: DC

## 2013-08-13 NOTE — Addendum Note (Signed)
Addended by: Azucena FreedMILLNER, Rigby Swamy C on: 08/13/2013 01:20 PM   Modules accepted: Orders

## 2013-08-20 ENCOUNTER — Telehealth: Payer: Self-pay | Admitting: Internal Medicine

## 2013-08-20 NOTE — Telephone Encounter (Signed)
Patient Information:  Caller Name: Elease Hashimotoatricia  Phone: [phone]  Patient: [name], Lauria  Gender: Female  DOB: 02-10-1976  Age: 38 Years  PCP: Eleonore ChiquitoKwiatkowski, Peter (Family Practice > 36yrs old)  Pregnant: No  Office Follow Up:  Does the office need to follow up with this patient?: Yes  Instructions For The Office: Patient declines appt . She is requesting another Rx for antibiotics.  [facility]  RN Note:  Patient declines appt . She is requesting another Rx for antibiotics.  [facility].  Symptoms  Reason For Call & Symptoms: Patient treated Strep throat on 08/13/13 and given Amoxiil 875mg   bid x10 days. Patient states she has 1 pill left and not totally well.  She states the left side of her throat is sore , tender to touch and swallow. Temperature 99.0  Reviewed Health History In EMR: Yes  Reviewed Medications In EMR: Yes  Reviewed Allergies In EMR: Yes  Reviewed Surgeries / Procedures: Yes  Date of Onset of Symptoms: 08/13/2013  Treatments Tried: Amoxil, gargle with salt water, Ibuprofen  Treatments Tried Worked: Yes  Any Fever: Yes  Fever Taken: Tactile  Fever Time Of Reading: 15:00:00  Fever Last Reading: N/A OB / GYN:  LMP: 08/19/2013  Guideline(s) Used:  Sore Throat  Disposition Per Guideline:   See Today or Tomorrow in Office  Reason For Disposition Reached:   Fever present > 3 days (72 hours)  Advice Given:  For Relief of Sore Throat Pain:  Gargle warm salt water 3 times daily (1 teaspoon of salt in 8 oz or 240 ml of warm water).  Avoid cigarette smoke.  Pain Medicines:  For pain relief, you can take either acetaminophen, ibuprofen, or naproxen.  They are over-the-counter (OTC) pain drugs. You can buy them at the drugstore.  Ibuprofen (e.g., Motrin, Advil):  Another choice is to take 600 mg (three 200 mg pills) by mouth every 8 hours.  Liquids:  Adequate liquid intake is important to prevent dehydration. Drink 6-8 glasses of  water per day.  Soft Diet:   Cold drinks and milk shakes are especially good (Reason: swollen tonsils can make some foods hard to swallow).  Call Back If:  You become worse.  RN Overrode Recommendation:  Patient Requests Prescription  Patient declines appt . She is requesting another Rx for antibiotics.  [facility].

## 2013-08-21 MED ORDER — CEFUROXIME AXETIL 250 MG PO TABS: 250.0000 mg | ORAL_TABLET | Freq: Two times a day (BID) | ORAL | Status: DC

## 2013-08-21 NOTE — Telephone Encounter (Signed)
Spoke to pt told her Rx for Ceftin 250 mg one po bid x 5 days was sent to pharmacy. Pt verbalized understanding.

## 2013-08-21 NOTE — Telephone Encounter (Signed)
Please advise 

## 2013-08-21 NOTE — Telephone Encounter (Signed)
Generic Ceftin 250 #10 one twice a day

## 2013-12-17 ENCOUNTER — Other Ambulatory Visit: Payer: Self-pay | Admitting: Internal Medicine

## 2014-03-12 ENCOUNTER — Telehealth: Payer: Self-pay | Admitting: Internal Medicine

## 2014-03-12 DIAGNOSIS — I10 Essential (primary) hypertension: Secondary | ICD-10-CM

## 2014-03-12 MED ORDER — AMLODIPINE BESYLATE 5 MG PO TABS: 5.0000 mg | ORAL_TABLET | Freq: Every day | ORAL | Status: DC

## 2014-03-12 MED ORDER — HYDROCHLOROTHIAZIDE 25 MG PO TABS: ORAL_TABLET | ORAL | Status: DC

## 2014-03-12 MED ORDER — RAMIPRIL 10 MG PO CAPS: ORAL_CAPSULE | ORAL | Status: DC

## 2014-03-12 NOTE — Telephone Encounter (Signed)
Left message on voicemail to call office.  

## 2014-03-12 NOTE — Telephone Encounter (Signed)
Pt notified Rx's sent to pharmacy and needs to have a physical scheduled. Pt verbalized understanding and will call back and schedule.

## 2014-03-12 NOTE — Telephone Encounter (Signed)
Pt request refill of the following: ramipril (ALTACE) 10 MG capsule ,amLODipine (NORVASC) 5 MG tablet,  hydrochlorothiazide (HYDRODIURIL) 25 MG tablet     Phamacy: [facility]

## 2014-03-25 ENCOUNTER — Ambulatory Visit: Payer: BC Managed Care – PPO | Admitting: Internal Medicine

## 2014-03-25 DIAGNOSIS — Z0289 Encounter for other administrative examinations: Secondary | ICD-10-CM

## 2014-05-13 ENCOUNTER — Encounter: Payer: Self-pay | Admitting: Family Medicine

## 2014-07-08 ENCOUNTER — Encounter (HOSPITAL_COMMUNITY): Payer: Self-pay | Admitting: Emergency Medicine

## 2014-07-08 ENCOUNTER — Other Ambulatory Visit: Payer: Self-pay | Admitting: Internal Medicine

## 2014-07-08 ENCOUNTER — Emergency Department (HOSPITAL_COMMUNITY): Payer: BC Managed Care – PPO

## 2014-07-08 ENCOUNTER — Emergency Department (HOSPITAL_COMMUNITY)
Admission: EM | Admit: 2014-07-08 | Discharge: 2014-07-09 | Disposition: A | Discharge status: Home / Self Care | Payer: BC Managed Care – PPO | Attending: Emergency Medicine | Admitting: Emergency Medicine

## 2014-07-08 DIAGNOSIS — Z720 Tobacco use: Secondary | ICD-10-CM | POA: Diagnosis not present

## 2014-07-08 DIAGNOSIS — R0602 Shortness of breath: Secondary | ICD-10-CM | POA: Insufficient documentation

## 2014-07-08 DIAGNOSIS — Z8781 Personal history of (healed) traumatic fracture: Secondary | ICD-10-CM | POA: Insufficient documentation

## 2014-07-08 DIAGNOSIS — I10 Essential (primary) hypertension: Secondary | ICD-10-CM | POA: Insufficient documentation

## 2014-07-08 DIAGNOSIS — Z3202 Encounter for pregnancy test, result negative: Secondary | ICD-10-CM | POA: Insufficient documentation

## 2014-07-08 DIAGNOSIS — Z79899 Other long term (current) drug therapy: Secondary | ICD-10-CM | POA: Insufficient documentation

## 2014-07-08 DIAGNOSIS — R079 Chest pain, unspecified: Secondary | ICD-10-CM

## 2014-07-08 LAB — CBC
HEMATOCRIT: 36.1 % (ref 36.0–46.0)
HEMOGLOBIN: 12 g/dL (ref 12.0–15.0)
MCH: 31.3 pg (ref 26.0–34.0)
MCHC: 33.2 g/dL (ref 30.0–36.0)
MCV: 94 fL (ref 78.0–100.0)
Platelets: 302 10*3/uL (ref 150–400)
RBC: 3.84 MIL/uL — AB (ref 3.87–5.11)
RDW: 14.1 % (ref 11.5–15.5)
WBC: 12.6 10*3/uL — ABNORMAL HIGH (ref 4.0–10.5)

## 2014-07-08 LAB — I-STAT TROPONIN, ED: TROPONIN I, POC: 0.03 ng/mL (ref 0.00–0.08)

## 2014-07-08 NOTE — ED Notes (Signed)
Pt arrived to the ED with a complaint of chest pain with shortness of breath.  Pt states she has had pain for 6 days.  Pt states that the pain is located on the left side below her breast.  Pain is described as a tightness.

## 2014-07-09 ENCOUNTER — Encounter (HOSPITAL_COMMUNITY): Payer: Self-pay

## 2014-07-09 ENCOUNTER — Emergency Department (HOSPITAL_COMMUNITY): Payer: BC Managed Care – PPO

## 2014-07-09 LAB — BASIC METABOLIC PANEL
Anion gap: 7 (ref 5–15)
BUN: 19 mg/dL (ref 6–23)
CALCIUM: 8.6 mg/dL (ref 8.4–10.5)
CO2: 22 mmol/L (ref 19–32)
Chloride: 106 mEq/L (ref 96–112)
Creatinine, Ser: 0.6 mg/dL (ref 0.50–1.10)
GFR calc Af Amer: >90 mL/min (ref >90)
GLUCOSE: 99 mg/dL (ref 70–99)
Potassium: 3.5 mmol/L (ref 3.5–5.1)
SODIUM: 135 mmol/L (ref 135–145)

## 2014-07-09 LAB — BRAIN NATRIURETIC PEPTIDE: B Natriuretic Peptide: 991.1 pg/mL — ABNORMAL HIGH (ref 0.0–100.0)

## 2014-07-09 LAB — POC URINE PREG, ED: PREG TEST UR: NEGATIVE

## 2014-07-09 MED ORDER — HYDROCOD POLST-CHLORPHEN POLST 10-8 MG/5ML PO LQCR
5.0000 mL | Freq: Once | ORAL | Status: AC
  Administered 2014-07-09: 5 mL via ORAL
  Filled 2014-07-09: qty 5

## 2014-07-09 MED ORDER — ALBUTEROL SULFATE (2.5 MG/3ML) 0.083% IN NEBU
5.0000 mg | INHALATION_SOLUTION | Freq: Once | RESPIRATORY_TRACT | Status: DC
  Filled 2014-07-09: qty 6

## 2014-07-09 MED ORDER — ALBUTEROL SULFATE HFA 108 (90 BASE) MCG/ACT IN AERS
2.0000 | INHALATION_SPRAY | Freq: Once | RESPIRATORY_TRACT | Status: AC
  Administered 2014-07-09: 2 via RESPIRATORY_TRACT
  Filled 2014-07-09: qty 6.7

## 2014-07-09 MED ORDER — AMLODIPINE BESYLATE 5 MG PO TABS
5.0000 mg | ORAL_TABLET | Freq: Once | ORAL | Status: AC
  Administered 2014-07-09: 5 mg via ORAL
  Filled 2014-07-09: qty 1

## 2014-07-09 MED ORDER — HYDROCHLOROTHIAZIDE 12.5 MG PO CAPS
25.0000 mg | ORAL_CAPSULE | Freq: Once | ORAL | Status: AC
  Administered 2014-07-09: 25 mg via ORAL
  Filled 2014-07-09: qty 2

## 2014-07-09 MED ORDER — IOHEXOL 350 MG/ML SOLN
100.0000 mL | Freq: Once | INTRAVENOUS | Status: AC | PRN
  Administered 2014-07-09: 100 mL via INTRAVENOUS

## 2014-07-09 MED ORDER — KETOROLAC TROMETHAMINE 30 MG/ML IJ SOLN
30.0000 mg | Freq: Once | INTRAMUSCULAR | Status: AC
Start: 2014-07-09 — End: 2014-07-09
  Administered 2014-07-09: 30 mg via INTRAVENOUS
  Filled 2014-07-09: qty 1

## 2014-07-09 MED ORDER — DOXYCYCLINE HYCLATE 100 MG PO CAPS: 100.0000 mg | ORAL_CAPSULE | Freq: Two times a day (BID) | ORAL | Status: DC

## 2014-07-09 MED ORDER — HYDROCOD POLST-CHLORPHEN POLST 10-8 MG/5ML PO LQCR: 5.0000 mL | Freq: Two times a day (BID) | ORAL | Status: DC | PRN

## 2014-07-09 MED ORDER — RAMIPRIL 10 MG PO CAPS
10.0000 mg | ORAL_CAPSULE | Freq: Every day | ORAL | Status: DC
  Filled 2014-07-09: qty 1

## 2014-07-09 MED ORDER — IPRATROPIUM-ALBUTEROL 0.5-2.5 (3) MG/3ML IN SOLN
3.0000 mL | Freq: Once | RESPIRATORY_TRACT | Status: AC
  Administered 2014-07-09: 3 mL via RESPIRATORY_TRACT
  Filled 2014-07-09: qty 3

## 2014-07-09 NOTE — ED Provider Notes (Signed)
URI recently with central CP, pleuritic component Abn. CXR - ?edema, atyp infection,  Blood tinged sputum Pending CT angio r/o PE Neg --> home with abx  CTA chest negative for PE. Re-eval.: The patient is doing well. She reports the nebulizer helped tremendously and has since started to cough and have chest tightness again and, so, is requesting another nebulized treatment, which is ordered. She is stable for discharge home.   Arnoldo HookerShari A Lamya Lausch, PA-C 07/09/14 0448  Doug SouSam Jacubowitz, MD 07/09/14 [phone]

## 2014-07-09 NOTE — Discharge Instructions (Signed)
Read the information below.  Use the prescribed medication as directed.  Please discuss all new medications with your pharmacist.  You may return to the Emergency Department at any time for worsening condition or any new symptoms that concern you.   If you develop worsening chest pain, shortness of breath, fever, you pass out, or become weak or dizzy, return to the ER for a recheck.    °

## 2014-07-09 NOTE — ED Provider Notes (Signed)
CSN: [id number]     Arrival date & time 07/08/14  2204 History   First MD Initiated Contact with Patient 07/08/14 2330     Chief Complaint  Patient presents with  . Chest Pain  . Shortness of Breath     (Consider location/radiation/quality/duration/timing/severity/associated sxs/prior Treatment) HPI   Pt with hx smoking, recent intermittent viral respiratory infections, p/w central chest pressure and squeezing pain that has been ongoing and gradually worsening x 6 days.  Notes it is hard to take a deep breath and feels SOB.  Husband notes coughing at night.  Cough has been productive of green/yellow sputum with some streaks of blood in it.  Denies fevers, leg swelling, recent immobilization.  She is on exogenous estrogen (PO birth control).  Father has had blood clots and MIs, first MI in his 3340s.  She has been out of her blood pressure medications (amlodipine, ramipril, HCTZ) for the past two weeks.    Past Medical History  Diagnosis Date  . Hypertension   . Compression fracture of lumbar vertebra    Past Surgical History  Procedure Laterality Date  . Back surgery    . Back surgery  07/2012    lumbar spinal fusion L4 Danielle Dess- Elsner, MD   History reviewed. No pertinent family history. History  Substance Use Topics  . Smoking status: Current Every Day Smoker -- 0.25 packs/day for 15 years    Types: Cigarettes  . Smokeless tobacco: Never Used     Comment: 4-5 cigarettes a day  . Alcohol Use: No     Comment: rarely   OB History    Gravida Para Term Preterm AB TAB SAB Ectopic Multiple Living   2 2 2  0 0 0 0 0 0 2     Review of Systems  All other systems reviewed and are negative.     Allergies  Review of patient's allergies indicates no known allergies.  Home Medications   Prior to Admission medications   Medication Sig Start Date End Date Taking? Authorizing Provider  amLODipine (NORVASC) 5 MG tablet TAKE 1 TABLET ONCE DAILY. 07/08/14  Yes Gordy SaversPeter F Kwiatkowski, MD   hydrochlorothiazide (HYDRODIURIL) 25 MG tablet TAKE 1 TABLET ONCE DAILY. 07/08/14  Yes Gordy SaversPeter F Kwiatkowski, MD  ibuprofen (ADVIL,MOTRIN) 200 MG tablet Take 800 mg by mouth every 6 (six) hours as needed (pain.).   Yes Historical Provider, MD  levETIRAcetam (KEPPRA) 250 MG tablet Take 500 mg by mouth every 12 (twelve) hours.    Yes Historical Provider, MD  MELATONIN PO Take 1 tablet by mouth at bedtime.   Yes Historical Provider, MD  Norethindrone Acetate-Ethinyl Estradiol (JUNEL 1.5/30) 1.5-30 MG-MCG tablet Take 1 tablet by mouth daily.   Yes Historical Provider, MD  NORTRIPTYLINE HCL PO Take 1 tablet by mouth at bedtime.   Yes Historical Provider, MD  amoxicillin (AMOXIL) 875 MG tablet Take 1 tablet (875 mg total) by mouth 2 (two) times daily. Patient not taking: Reported on 07/08/2014 08/13/13   Terressa KoyanagiHannah R Kim, DO  cefUROXime (CEFTIN) 250 MG tablet Take 1 tablet (250 mg total) by mouth 2 (two) times daily with a meal. Patient not taking: Reported on 07/08/2014 08/21/13   Gordy SaversPeter F Kwiatkowski, MD  DULoxetine (CYMBALTA) 60 MG capsule Take 60 mg by mouth daily.    Historical Provider, MD  Multiple Vitamin (MULTIVITAMIN WITH MINERALS) TABS Take 1 tablet by mouth daily.    Historical Provider, MD  naproxen sodium (ANAPROX) 220 MG tablet Take 220 mg by mouth  2 (two) times daily as needed (for pain).    Historical Provider, MD  ramipril (ALTACE) 10 MG capsule TAKE (1) CAPSULE DAILY. Patient not taking: Reported on 07/08/2014 07/08/14   Gordy SaversPeter F Kwiatkowski, MD   BP 134/100 mmHg  Pulse 106  Temp(Src) 98.3 F (36.8 C)  Resp 25  SpO2 100%  LMP 06/17/2014 (Exact Date) Physical Exam  Constitutional: She appears well-developed and well-nourished. No distress.  HENT:  Head: Normocephalic and atraumatic.  Neck: Neck supple.  Cardiovascular: Normal rate and regular rhythm.   Pulmonary/Chest: Effort normal. No accessory muscle usage. No respiratory distress. She has decreased breath sounds (pt unable to take  deep breath). She has no wheezes. She has no rhonchi. She has no rales. She exhibits tenderness (does not reproduce pain).  Abdominal: Soft. She exhibits no distension. There is no tenderness. There is no rebound and no guarding.  Neurological: She is alert.  Skin: She is not diaphoretic.  Nursing note and vitals reviewed.   ED Course  Procedures (including critical care time) Labs Review Labs Reviewed  CBC - Abnormal; Notable for the following:    WBC 12.6 (*)    RBC 3.84 (*)    All other components within normal limits  BASIC METABOLIC PANEL  BRAIN NATRIURETIC PEPTIDE  I-STAT TROPOININ, ED  POC URINE PREG, ED    Imaging Review Dg Chest 2 View  07/09/2014   CLINICAL DATA:  Chest pain and shortness of breath  EXAM: CHEST  2 VIEW  COMPARISON:  03/08/2013  FINDINGS: Generous heart size without definite cardiomegaly. There is new interlobular septal thickening diffusely. No pneumonia, effusion, or pneumothorax.  IMPRESSION: Interstitial abnormality consistent with pulmonary edema. If there are infectious symptoms, note that atypical infection can also give this appearance.   Electronically Signed   By: Tiburcio PeaJonathan  Watts M.D.   On: 07/09/2014 00:52     EKG Interpretation None        Date: 07/09/2014  Rate: 117  Rhythm: sinus tachycardia  QRS Axis: normal  Intervals: QT prolonged  ST/T Wave abnormalities: nonspecific T wave changes  Conduction Disutrbances:none  Narrative Interpretation:   Old EKG Reviewed: none available     Per Wells' Criteria for Pulmonary Embolism, pt is moderate risk group   MDM   Final diagnoses:  Chest pain  SOB (shortness of breath)    Afebrile nontoxic patient with chest pressure and tightness, worse with attempting to take deep inspiration, SOB, hemoptysis.  She is on estrogen and has family hx blood clots.  CXR is abnormal.  She feels much better after neb treatment. CT angio chest pending at change of shift.  Labs significant for leukocytosis.  Patient discussed with Elpidio AnisShari Upstill, PA-C, who assumes care of patient pending CT and BNP.   If CT negative, anticipate d/c home with albuterol, doxycycline, tussionex.    Trixie Dredgemily Brianny Soulliere, PA-C 07/09/14 0147  Doug SouSam Jacubowitz, MD 07/09/14 [phone]

## 2014-07-17 ENCOUNTER — Telehealth: Payer: Self-pay | Admitting: Internal Medicine

## 2014-07-17 NOTE — Telephone Encounter (Signed)
Patient states she was discharged from the hospital and they ordered lab work, she would like to know if she still need to have the CPX lab appointment in February??  She declined making a s/p hospital f/u appointment.

## 2014-07-17 NOTE — Telephone Encounter (Signed)
[name], pt still needs to keep appt for Physical if she wants her medications ordered. Has not seen Dr. Kirtland BouchardK since 02/2013.

## 2014-07-17 NOTE — Telephone Encounter (Signed)
She is keeping the CPX appointment, she wants to know about the lab appointment.

## 2014-07-18 NOTE — Telephone Encounter (Signed)
LMOM for patient to keep lab appointment per my conversation with Lupita Leashonna.

## 2014-08-27 ENCOUNTER — Other Ambulatory Visit (INDEPENDENT_AMBULATORY_CARE_PROVIDER_SITE_OTHER): Payer: BLUE CROSS/BLUE SHIELD

## 2014-08-27 DIAGNOSIS — Z0000 Encounter for general adult medical examination without abnormal findings: Secondary | ICD-10-CM

## 2014-08-27 LAB — HEPATIC FUNCTION PANEL
ALBUMIN: 4.3 g/dL (ref 3.5–5.2)
ALK PHOS: 43 U/L (ref 39–117)
ALT: 14 U/L (ref 0–35)
AST: 18 U/L (ref 0–37)
BILIRUBIN DIRECT: 0.1 mg/dL (ref 0.0–0.3)
BILIRUBIN TOTAL: 0.6 mg/dL (ref 0.2–1.2)
Total Protein: 7.2 g/dL (ref 6.0–8.3)

## 2014-08-27 LAB — LIPID PANEL
CHOLESTEROL: 149 mg/dL (ref 0–200)
HDL: 48.2 mg/dL (ref >39.00)
LDL CALC: 72 mg/dL (ref 0–99)
NonHDL: 100.8
TRIGLYCERIDES: 144 mg/dL (ref 0.0–149.0)
Total CHOL/HDL Ratio: 3
VLDL: 28.8 mg/dL (ref 0.0–40.0)

## 2014-08-27 LAB — TSH: TSH: 0.58 u[IU]/mL (ref 0.35–4.50)

## 2014-09-03 ENCOUNTER — Ambulatory Visit (INDEPENDENT_AMBULATORY_CARE_PROVIDER_SITE_OTHER): Payer: BLUE CROSS/BLUE SHIELD | Admitting: Internal Medicine

## 2014-09-03 ENCOUNTER — Encounter: Payer: Self-pay | Admitting: Internal Medicine

## 2014-09-03 VITALS — BP 138/90 | HR 116 | Temp 98.6°F | Resp 20 | Ht 63.5 in | Wt 140.0 lb

## 2014-09-03 DIAGNOSIS — Z0000 Encounter for general adult medical examination without abnormal findings: Secondary | ICD-10-CM

## 2014-09-03 DIAGNOSIS — I10 Essential (primary) hypertension: Secondary | ICD-10-CM

## 2014-09-03 NOTE — Progress Notes (Signed)
Pre visit review using our clinic review tool, if applicable. No additional management support is needed unless otherwise documented below in the visit note. 

## 2014-09-03 NOTE — Patient Instructions (Signed)
Limit your sodium (Salt) intake    It is important that you exercise regularly, at least 20 minutes 3 to 4 times per week.  If you develop chest pain or shortness of breath seek  medical attention.  Please check your blood pressure on a regular basis.  If it is consistently greater than 150/90, please make an office appointment.  Health Maintenance Adopting a healthy lifestyle and getting preventive care can go a long way to promote health and wellness. Talk with your health care provider about what schedule of regular examinations is right for you. This is a good chance for you to check in with your provider about disease prevention and staying healthy. In between checkups, there are plenty of things you can do on your own. Experts have done a lot of research about which lifestyle changes and preventive measures are most likely to keep you healthy. Ask your health care provider for more information. WEIGHT AND DIET  Eat a healthy diet  Be sure to include plenty of vegetables, fruits, low-fat dairy products, and lean protein.  Do not eat a lot of foods high in solid fats, added sugars, or salt.  Get regular exercise. This is one of the most important things you can do for your health.  Most adults should exercise for at least 150 minutes each week. The exercise should increase your heart rate and make you sweat (moderate-intensity exercise).  Most adults should also do strengthening exercises at least twice a week. This is in addition to the moderate-intensity exercise.  Maintain a healthy weight  Body mass index (BMI) is a measurement that can be used to identify possible weight problems. It estimates body fat based on height and weight. Your health care provider can help determine your BMI and help you achieve or maintain a healthy weight.  For females 20 years of age and older:   A BMI below 18.5 is considered underweight.  A BMI of 18.5 to 24.9 is normal.  A BMI of 25 to 29.9 is  considered overweight.  A BMI of 30 and above is considered obese.  Watch levels of cholesterol and blood lipids  You should start having your blood tested for lipids and cholesterol at 37 years of age, then have this test every 5 years.  You may need to have your cholesterol levels checked more often if:  Your lipid or cholesterol levels are high.  You are older than 37 years of age.  You are at high risk for heart disease.  CANCER SCREENING   Lung Cancer  Lung cancer screening is recommended for adults 55-80 years old who are at high risk for lung cancer because of a history of smoking.  A yearly low-dose CT scan of the lungs is recommended for people who:  Currently smoke.  Have quit within the past 15 years.  Have at least a 30-pack-year history of smoking. A pack year is smoking an average of one pack of cigarettes a day for 1 year.  Yearly screening should continue until it has been 15 years since you quit.  Yearly screening should stop if you develop a health problem that would prevent you from having lung cancer treatment.  Breast Cancer  Practice breast self-awareness. This means understanding how your breasts normally appear and feel.  It also means doing regular breast self-exams. Let your health care provider know about any changes, no matter how small.  If you are in your 20s or 30s, you should have a   clinical breast exam (CBE) by a health care provider every 1-3 years as part of a regular health exam.  If you are 40 or older, have a CBE every year. Also consider having a breast X-ray (mammogram) every year.  If you have a family history of breast cancer, talk to your health care provider about genetic screening.  If you are at high risk for breast cancer, talk to your health care provider about having an MRI and a mammogram every year.  Breast cancer gene (BRCA) assessment is recommended for women who have family members with BRCA-related cancers.  BRCA-related cancers include:  Breast.  Ovarian.  Tubal.  Peritoneal cancers.  Results of the assessment will determine the need for genetic counseling and BRCA1 and BRCA2 testing. Cervical Cancer Routine pelvic examinations to screen for cervical cancer are no longer recommended for nonpregnant women who are considered low risk for cancer of the pelvic organs (ovaries, uterus, and vagina) and who do not have symptoms. A pelvic examination may be necessary if you have symptoms including those associated with pelvic infections. Ask your health care provider if a screening pelvic exam is right for you.   The Pap test is the screening test for cervical cancer for women who are considered at risk.  If you had a hysterectomy for a problem that was not cancer or a condition that could lead to cancer, then you no longer need Pap tests.  If you are older than 65 years, and you have had normal Pap tests for the past 10 years, you no longer need to have Pap tests.  If you have had past treatment for cervical cancer or a condition that could lead to cancer, you need Pap tests and screening for cancer for at least 20 years after your treatment.  If you no longer get a Pap test, assess your risk factors if they change (such as having a new sexual partner). This can affect whether you should start being screened again.  Some women have medical problems that increase their chance of getting cervical cancer. If this is the case for you, your health care provider may recommend more frequent screening and Pap tests.  The human papillomavirus (HPV) test is another test that may be used for cervical cancer screening. The HPV test looks for the virus that can cause cell changes in the cervix. The cells collected during the Pap test can be tested for HPV.  The HPV test can be used to screen women 30 years of age and older. Getting tested for HPV can extend the interval between normal Pap tests from three to  five years.  An HPV test also should be used to screen women of any age who have unclear Pap test results.  After 37 years of age, women should have HPV testing as often as Pap tests.  Colorectal Cancer  This type of cancer can be detected and often prevented.  Routine colorectal cancer screening usually begins at 37 years of age and continues through 37 years of age.  Your health care provider may recommend screening at an earlier age if you have risk factors for colon cancer.  Your health care provider may also recommend using home test kits to check for hidden blood in the stool.  A small camera at the end of a tube can be used to examine your colon directly (sigmoidoscopy or colonoscopy). This is done to check for the earliest forms of colorectal cancer.  Routine screening usually begins at   age 5.  Direct examination of the colon should be repeated every 5-10 years through 37 years of age. However, you may need to be screened more often if early forms of precancerous polyps or small growths are found. Skin Cancer  Check your skin from head to toe regularly.  Tell your health care provider about any new moles or changes in moles, especially if there is a change in a mole's shape or color.  Also tell your health care provider if you have a mole that is larger than the size of a pencil eraser.  Always use sunscreen. Apply sunscreen liberally and repeatedly throughout the day.  Protect yourself by wearing long sleeves, pants, a wide-brimmed hat, and sunglasses whenever you are outside. HEART DISEASE, DIABETES, AND HIGH BLOOD PRESSURE   Have your blood pressure checked at least every 1-2 years. High blood pressure causes heart disease and increases the risk of stroke.  If you are between 70 years and 61 years old, ask your health care provider if you should take aspirin to prevent strokes.  Have regular diabetes screenings. This involves taking a blood sample to check your  fasting blood sugar level.  If you are at a normal weight and have a low risk for diabetes, have this test once every three years after 37 years of age.  If you are overweight and have a high risk for diabetes, consider being tested at a younger age or more often. PREVENTING INFECTION  Hepatitis B  If you have a higher risk for hepatitis B, you should be screened for this virus. You are considered at high risk for hepatitis B if:  You were born in a country where hepatitis B is common. Ask your health care provider which countries are considered high risk.  Your parents were born in a high-risk country, and you have not been immunized against hepatitis B (hepatitis B vaccine).  You have HIV or AIDS.  You use needles to inject street drugs.  You live with someone who has hepatitis B.  You have had sex with someone who has hepatitis B.  You get hemodialysis treatment.  You take certain medicines for conditions, including cancer, organ transplantation, and autoimmune conditions. Hepatitis C  Blood testing is recommended for:  Everyone born from [birth year] through 1963.  Anyone with known risk factors for hepatitis C. Sexually transmitted infections (STIs)  You should be screened for sexually transmitted infections (STIs) including gonorrhea and chlamydia if:  You are sexually active and are younger than 37 years of age.  You are older than 37 years of age and your health care provider tells you that you are at risk for this type of infection.  Your sexual activity has changed since you were last screened and you are at an increased risk for chlamydia or gonorrhea. Ask your health care provider if you are at risk.  If you do not have HIV, but are at risk, it may be recommended that you take a prescription medicine daily to prevent HIV infection. This is called pre-exposure prophylaxis (PrEP). You are considered at risk if:  You are sexually active and do not regularly use condoms or  know the HIV status of your partner(s).  You take drugs by injection.  You are sexually active with a partner who has HIV. Talk with your health care provider about whether you are at high risk of being infected with HIV. If you choose to begin PrEP, you should first be tested for HIV. You  should then be tested every 3 months for as long as you are taking PrEP.  PREGNANCY   If you are premenopausal and you may become pregnant, ask your health care provider about preconception counseling.  If you may become pregnant, take 400 to 800 micrograms (mcg) of folic acid every day.  If you want to prevent pregnancy, talk to your health care provider about birth control (contraception). OSTEOPOROSIS AND MENOPAUSE   Osteoporosis is a disease in which the bones lose minerals and strength with aging. This can result in serious bone fractures. Your risk for osteoporosis can be identified using a bone density scan.  If you are 73 years of age or older, or if you are at risk for osteoporosis and fractures, ask your health care provider if you should be screened.  Ask your health care provider whether you should take a calcium or vitamin D supplement to lower your risk for osteoporosis.  Menopause may have certain physical symptoms and risks.  Hormone replacement therapy may reduce some of these symptoms and risks. Talk to your health care provider about whether hormone replacement therapy is right for you.  HOME CARE INSTRUCTIONS   Schedule regular health, dental, and eye exams.  Stay current with your immunizations.   Do not use any tobacco products including cigarettes, chewing tobacco, or electronic cigarettes.  If you are pregnant, do not drink alcohol.  If you are breastfeeding, limit how much and how often you drink alcohol.  Limit alcohol intake to no more than 1 drink per day for nonpregnant women. One drink equals 12 ounces of beer, 5 ounces of wine, or 1 ounces of hard liquor.  Do  not use street drugs.  Do not share needles.  Ask your health care provider for help if you need support or information about quitting drugs.  Tell your health care provider if you often feel depressed.  Tell your health care provider if you have ever been abused or do not feel safe at home. Document Released: 01/11/2011 Document Revised: 11/12/2013 Document Reviewed: 05/30/2013 [facility] Patient Information 2015 [place], [place]. This information is not intended to replace advice given to you by your health care provider. Make sure you discuss any questions you have with your health care provider.

## 2014-09-03 NOTE — Progress Notes (Signed)
Subjective:    Patient ID: [name], female    DOB: 22-Sep-1975, 39 y.o.   MRN: [id number]  HPI 39 year old patient who is seen today for a wellness exam.  She has a history of hypertension that requires triple therapy.  Medical regimen includes Ace inhibition.  She does take birth control pills and is not planning any future pregnancies.  She has 2 children age 71 and 58.  She is doing well today She does have some chronic low back pain and prior back surgery which has done well with conservative treatment and physical therapy with a personal trainer.  She has failed a spinal stimulator in the past.  Medical regimen does include nortriptyline and Keppra for her low back pain and neuropathic his comfort No new concerns or complaints  Family history father age 35 with a history of cardiac and cerebrovascular disease and hypertension Mother died age 67 of lung cancer 2 brothers, 2 sisters.  Positive for hypertension  Past Medical History  Diagnosis Date  . Hypertension   . Compression fracture of lumbar vertebra     History   Social History  . Marital Status: Married    Spouse Name: N/A  . Number of Children: N/A  . Years of Education: N/A   Occupational History  . Not on file.   Social History Main Topics  . Smoking status: Current Every Day Smoker -- 0.25 packs/day for 15 years    Types: Cigarettes  . Smokeless tobacco: Never Used     Comment: 4-5 cigarettes a day  . Alcohol Use: No     Comment: rarely  . Drug Use: No  . Sexual Activity: Yes   Other Topics Concern  . Not on file   Social History Narrative    Past Surgical History  Procedure Laterality Date  . Back surgery    . Back surgery  07/2012    lumbar spinal fusion L4 Danielle Dess, MD    No family history on file.  No Known Allergies  Current Outpatient Prescriptions on File Prior to Visit  Medication Sig Dispense Refill  . amLODipine (NORVASC) 5 MG tablet TAKE 1 TABLET ONCE DAILY. 90 tablet 0    . hydrochlorothiazide (HYDRODIURIL) 25 MG tablet TAKE 1 TABLET ONCE DAILY. 90 tablet 0  . ibuprofen (ADVIL,MOTRIN) 200 MG tablet Take 800 mg by mouth every 6 (six) hours as needed (pain.).    Marland Kitchen levETIRAcetam (KEPPRA) 250 MG tablet Take 500 mg by mouth every 12 (twelve) hours.     Marland Kitchen MELATONIN PO Take 1 tablet by mouth at bedtime as needed.     . naproxen sodium (ANAPROX) 220 MG tablet Take 220 mg by mouth 2 (two) times daily as needed (for pain).    . NORTRIPTYLINE HCL PO Take 1 tablet by mouth at bedtime.    . ramipril (ALTACE) 10 MG capsule TAKE (1) CAPSULE DAILY. 90 capsule 0   No current facility-administered medications on file prior to visit.    BP 138/90 mmHg  Pulse 116  Temp(Src) 98.6 F (37 C) (Oral)  Resp 20  Ht 5' 3.5" (1.613 m)  Wt 140 lb (63.504 kg)  BMI 24.41 kg/m2  SpO2 98%      Review of Systems  Constitutional: Negative.   HENT: Negative for congestion, dental problem, hearing loss, rhinorrhea, sinus pressure, sore throat and tinnitus.   Eyes: Negative for pain, discharge and visual disturbance.  Respiratory: Negative for cough and shortness of breath.   Cardiovascular:  Negative for chest pain, palpitations and leg swelling.  Gastrointestinal: Negative for nausea, vomiting, abdominal pain, diarrhea, constipation, blood in stool and abdominal distention.  Genitourinary: Negative for dysuria, urgency, frequency, hematuria, flank pain, vaginal bleeding, vaginal discharge, difficulty urinating, vaginal pain and pelvic pain.  Musculoskeletal: Negative for joint swelling, arthralgias and gait problem.  Skin: Negative for rash.  Neurological: Negative for dizziness, syncope, speech difficulty, weakness, numbness and headaches.  Hematological: Negative for adenopathy.  Psychiatric/Behavioral: Negative for behavioral problems, dysphoric mood and agitation. The patient is not nervous/anxious.        Objective:   Physical Exam  Constitutional: She is oriented to  person, place, and time. She appears well-developed and well-nourished.  Blood pressure improved.  As low as 120 over 82  HENT:  Head: Normocephalic.  Right Ear: External ear normal.  Left Ear: External ear normal.  Mouth/Throat: Oropharynx is clear and moist.  Eyes: Conjunctivae and EOM are normal. Pupils are equal, round, and reactive to light.  Neck: Normal range of motion. Neck supple. No thyromegaly present.  Cardiovascular: Normal rate, regular rhythm, normal heart sounds and intact distal pulses.   Pulmonary/Chest: Effort normal and breath sounds normal.  Abdominal: Soft. Bowel sounds are normal. She exhibits no mass. There is no tenderness.  Musculoskeletal: Normal range of motion.  Lymphadenopathy:    She has no cervical adenopathy.  Neurological: She is alert and oriented to person, place, and time.  Skin: Skin is warm and dry. No rash noted.  Psychiatric: She has a normal mood and affect. Her behavior is normal.          Assessment & Plan:  Preventive health exam Hypertension, well-controlled.  We'll continue restricted salt diet, exercise regimen and triple drug therapy.  Will continue home blood pressure monitoring.  Return in one year or as needed

## 2014-09-04 ENCOUNTER — Telehealth: Payer: Self-pay | Admitting: Internal Medicine

## 2014-09-04 NOTE — Telephone Encounter (Signed)
emmi mailed  °

## 2014-10-02 ENCOUNTER — Ambulatory Visit (INDEPENDENT_AMBULATORY_CARE_PROVIDER_SITE_OTHER): Payer: BLUE CROSS/BLUE SHIELD | Admitting: Nurse Practitioner

## 2014-10-02 ENCOUNTER — Encounter: Payer: Self-pay | Admitting: Nurse Practitioner

## 2014-10-02 VITALS — BP 111/81 | HR 117 | Temp 98.7°F | Ht 63.5 in | Wt 138.0 lb

## 2014-10-02 DIAGNOSIS — J029 Acute pharyngitis, unspecified: Secondary | ICD-10-CM | POA: Diagnosis not present

## 2014-10-02 DIAGNOSIS — J069 Acute upper respiratory infection, unspecified: Secondary | ICD-10-CM

## 2014-10-02 DIAGNOSIS — H109 Unspecified conjunctivitis: Secondary | ICD-10-CM | POA: Diagnosis not present

## 2014-10-02 LAB — POCT RAPID STREP A (OFFICE): Rapid Strep A Screen: NEGATIVE

## 2014-10-02 MED ORDER — HYDROCODONE-HOMATROPINE 5-1.5 MG/5ML PO SYRP: 5.0000 mL | ORAL_SOLUTION | Freq: Every evening | ORAL | Status: DC | PRN

## 2014-10-02 MED ORDER — POLYMYXIN B-TRIMETHOPRIM 10000-0.1 UNIT/ML-% OP SOLN: 1.0000 [drp] | OPHTHALMIC | Status: DC

## 2014-10-02 NOTE — Patient Instructions (Signed)
You have a virus causing your symptoms. The average duration of cold symptoms is 14 days.  For nasal congestion & cough: Start daily sinus rinses (neilmed Sinus Rinse). Use 30 mg to 60 mg pseudoephedrine 2 to 3 times daily. Vicks vapor rub under nose to help breathe. Use cough syrup at night as it will cause drowsiness. For sore throat: Benzocaine throat lozenges for sore throat. Tylenol or ibuprophen for headache. Sip fluids every hour. Rest. If you are not feeling better in 10 days or develop fever or chest pain, call us for re-evaluation. Feel better!  Upper Respiratory Infection, Adult An upper respiratory infection (URI) is also sometimes known as the common cold. The upper respiratory tract includes the nose, sinuses, throat, trachea, and bronchi. Bronchi are the airways leading to the lungs. Most people improve within 1 week, but symptoms can last up to 2 weeks. A residual cough may last even longer.  CAUSES Many different viruses can infect the tissues lining the upper respiratory tract. The tissues become irritated and inflamed and often become very moist. Mucus production is also common. A cold is contagious. You can easily spread the virus to others by oral contact. This includes kissing, sharing a glass, coughing, or sneezing. Touching your mouth or nose and then touching a surface, which is then touched by another person, can also spread the virus. SYMPTOMS  Symptoms typically develop 1 to 3 days after you come in contact with a cold virus. Symptoms vary from person to person. They may include:  Runny nose.  Sneezing.  Nasal congestion.  Sinus irritation.  Sore throat.  Loss of voice (laryngitis).  Cough.  Fatigue.  Muscle aches.  Loss of appetite.  Headache.  Low-grade fever. DIAGNOSIS  You might diagnose your own cold based on familiar symptoms, since most people get a cold 2 to 3 times a year. Your caregiver can confirm this based on your exam. Most importantly,  your caregiver can check that your symptoms are not due to another disease such as strep throat, sinusitis, pneumonia, asthma, or epiglottitis. Blood tests, throat tests, and X-rays are not necessary to diagnose a common cold, but they may sometimes be helpful in excluding other more serious diseases. Your caregiver will decide if any further tests are required. RISKS AND COMPLICATIONS  You may be at risk for a more severe case of the common cold if you smoke cigarettes, have chronic heart disease (such as heart failure) or lung disease (such as asthma), or if you have a weakened immune system. The very young and very old are also at risk for more serious infections. Bacterial sinusitis, middle ear infections, and bacterial pneumonia can complicate the common cold. The common cold can worsen asthma and chronic obstructive pulmonary disease (COPD). Sometimes, these complications can require emergency medical care and may be life-threatening. PREVENTION  The best way to protect against getting a cold is to practice good hygiene. Avoid oral or hand contact with people with cold symptoms. Wash your hands often if contact occurs. There is no clear evidence that vitamin C, vitamin E, echinacea, or exercise reduces the chance of developing a cold. However, it is always recommended to get plenty of rest and practice good nutrition. TREATMENT  Treatment is directed at relieving symptoms. There is no cure. Antibiotics are not effective, because the infection is caused by a virus, not by bacteria. Treatment may include:  Increased fluid intake. Sports drinks offer valuable electrolytes, sugars, and fluids.  Breathing heated mist or steam (  vaporizer or shower).  Eating chicken soup or other clear broths, and maintaining good nutrition.  Getting plenty of rest.  Using gargles or lozenges for comfort.  Controlling fevers with ibuprofen or acetaminophen as directed by your caregiver.  Increasing usage of your  inhaler if you have asthma. Zinc gel and zinc lozenges, taken in the first 24 hours of the common cold, can shorten the duration and lessen the severity of symptoms. Pain medicines may help with fever, muscle aches, and throat pain. A variety of non-prescription medicines are available to treat congestion and runny nose. Your caregiver can make recommendations and may suggest nasal or lung inhalers for other symptoms.  HOME CARE INSTRUCTIONS   Only take over-the-counter or prescription medicines for pain, discomfort, or fever as directed by your caregiver.  Use a warm mist humidifier or inhale steam from a shower to increase air moisture. This may keep secretions moist and make it easier to breathe.  Drink enough water and fluids to keep your urine clear or pale yellow.  Rest as needed.  Return to work when your temperature has returned to normal or as your caregiver advises. You may need to stay home longer to avoid infecting others. You can also use a face mask and careful hand washing to prevent spread of the virus. SEEK MEDICAL CARE IF:   After the first few days, you feel you are getting worse rather than better.  You need your caregiver's advice about medicines to control symptoms.  You develop chills, worsening shortness of breath, or brown or red sputum. These may be signs of pneumonia.  You develop yellow or brown nasal discharge or pain in the face, especially when you bend forward. These may be signs of sinusitis.  You develop a fever, swollen neck glands, pain with swallowing, or white areas in the back of your throat. These may be signs of strep throat. SEEK IMMEDIATE MEDICAL CARE IF:   You have a fever.  You develop severe or persistent headache, ear pain, sinus pain, or chest pain.  You develop wheezing, a prolonged cough, cough up blood, or have a change in your usual mucus (if you have chronic lung disease).  You develop sore muscles or a stiff neck. Document  Released: 12/22/2000 Document Revised: 09/20/2011 Document Reviewed: 10/30/2010 [facility] Patient Information 2014 [place], [place].

## 2014-10-02 NOTE — Progress Notes (Signed)
Pre visit review using our clinic review tool, if applicable. No additional management support is needed unless otherwise documented below in the visit note. 

## 2014-10-02 NOTE — Progress Notes (Signed)
   Subjective:    Patient ID: Denise CornfieldPatricia A Goodwin, female    DOB: 07/02/1976, 39 y.o.   MRN: [id number]  HPI Comments: Both kids have been sick with similar symptoms.  URI  This is a new problem. The current episode started in the past 7 days (4d). The problem has been gradually worsening. The maximum temperature recorded prior to her arrival was 101 - 101.9 F. The fever has been present for 1 to 2 days. Associated symptoms include congestion (nasal-worse when lying down), coughing, headaches, a plugged ear sensation, sinus pain and a sore throat. Pertinent negatives include no chest pain or nausea. She has tried decongestant and NSAIDs for the symptoms. The treatment provided no relief.      Review of Systems  HENT: Positive for congestion (nasal-worse when lying down) and sore throat.   Respiratory: Positive for cough.   Cardiovascular: Negative for chest pain.  Gastrointestinal: Negative for nausea.  Neurological: Positive for headaches.       Objective:   Physical Exam  Constitutional: She is oriented to person, place, and time. She appears well-developed and well-nourished. No distress.  HENT:  Head: Normocephalic and atraumatic.  Right Ear: External ear normal.  Left Ear: External ear normal.  Mouth/Throat: No oropharyngeal exudate.  +2 tonsils, no exudate. Posterior pharynx erythematous Hoarse Nasal quality to voice   Eyes: Pupils are equal, round, and reactive to light. Right eye exhibits no discharge and no exudate. No foreign body present in the right eye. Left eye exhibits no discharge and no exudate. No foreign body present in the left eye. Right conjunctiva is injected. Left conjunctiva is not injected.  Neck: Normal range of motion. Neck supple. No thyromegaly present.  Cardiovascular: Normal rate, regular rhythm and normal heart sounds.   No murmur heard. Pulmonary/Chest: Breath sounds normal. No respiratory distress. She has no wheezes. She has no rales.    Lymphadenopathy:    She has no cervical adenopathy.  Neurological: She is alert and oriented to person, place, and time.  Skin: Skin is warm and dry.  Psychiatric: She has a normal mood and affect. Her behavior is normal.  Vitals reviewed.         Assessment & Plan:  1. Upper respiratory infection with cough and congestion Symptom support See intructions Pt asking for ABX "because she is going out of town" Educated on no indication for ABX use for viruses. She sd "I know, my sister's a doctor". - HYDROcodone-homatropine (HYCODAN) 5-1.5 MG/5ML syrup; Take 5 mLs by mouth at bedtime as needed for cough.  Dispense: 120 mL; Refill: 0  2. Sore throat Likely viral - POCT rapid strep A-neg  3. Conjunctivitis of right eye Likely viral-adenovirus? - trimethoprim-polymyxin b (POLYTRIM) ophthalmic solution; Place 1 drop into the right eye every 4 (four) hours.  Dispense: 10 mL; Refill: 0  F/u PRN

## 2014-10-07 ENCOUNTER — Other Ambulatory Visit: Payer: Self-pay | Admitting: Internal Medicine

## 2014-10-07 ENCOUNTER — Telehealth: Payer: Self-pay | Admitting: Internal Medicine

## 2014-10-07 NOTE — Telephone Encounter (Signed)
rx sent to pharmacy

## 2014-10-07 NOTE — Telephone Encounter (Signed)
Pt request refill  hydrochlorothiazide (HYDRODIURIL) 25 MG tablet ramipril (ALTACE) 10 MG capsule amLODipine (NORVASC) 5 MG tablet  [facility]  Pt seen 2/23 , had some left, but now is completely out

## 2015-02-11 ENCOUNTER — Telehealth: Payer: Self-pay | Admitting: Internal Medicine

## 2015-02-11 MED ORDER — SULFACETAMIDE SODIUM 10 % OP SOLN: 2.0000 [drp] | Freq: Four times a day (QID) | OPHTHALMIC | Status: DC

## 2015-02-11 NOTE — Telephone Encounter (Signed)
Spoke to pt, asked her which eye, pt said it is in both. Told pt okay Rx sent to pharmacy use 2 drops both eyes 4 x a day for 7-10 days. Pt verbalized understanding.

## 2015-02-11 NOTE — Telephone Encounter (Signed)
Pt said this is her second bout with pink eye and is asking if something can be called in    Pharmacy  [facility]

## 2015-02-11 NOTE — Telephone Encounter (Signed)
Generic bleph 10  2 drops affected eye  QID

## 2015-02-11 NOTE — Telephone Encounter (Signed)
Please see message and advise 

## 2015-06-20 ENCOUNTER — Encounter: Payer: Self-pay | Admitting: Adult Health

## 2015-06-20 ENCOUNTER — Ambulatory Visit (INDEPENDENT_AMBULATORY_CARE_PROVIDER_SITE_OTHER): Payer: BLUE CROSS/BLUE SHIELD | Admitting: Adult Health

## 2015-06-20 ENCOUNTER — Ambulatory Visit: Payer: BLUE CROSS/BLUE SHIELD | Admitting: Adult Health

## 2015-06-20 VITALS — BP 104/78 | Temp 98.7°F | Ht 63.5 in | Wt 127.8 lb

## 2015-06-20 DIAGNOSIS — J0100 Acute maxillary sinusitis, unspecified: Secondary | ICD-10-CM | POA: Diagnosis not present

## 2015-06-20 MED ORDER — DOXYCYCLINE HYCLATE 100 MG PO CAPS: 100.0000 mg | ORAL_CAPSULE | Freq: Two times a day (BID) | ORAL | Status: DC

## 2015-06-20 NOTE — Progress Notes (Signed)
Subjective:    Patient ID: [name], female    DOB: [birth year], 39 y.o.   MRN: [id number]  HPI  39 year old female who presents to the office today for one month of semi productive cough, chest and sinus congestion, sinus pain and pressure in maxillary sinus cavities, PND, and rhinorrhea. She has been using OTC Mucinex and a Netty Pot three times a day. The netty pot provides a little relief.   She has two more days of prednisone therapy for back issues.   She denies any fevers, n/v/d or feeling acutely ill.    Review of Systems  Constitutional: Negative.   HENT: Positive for congestion, postnasal drip, rhinorrhea and sinus pressure. Negative for ear discharge, ear pain, sore throat and voice change.   Eyes: Negative.   Respiratory: Positive for cough. Negative for shortness of breath.   Neurological: Positive for headaches.  Hematological: Negative.   All other systems reviewed and are negative.  Past Medical History  Diagnosis Date  . Hypertension   . Compression fracture of lumbar vertebra [facility](HCC)     Social History   Social History  . Marital Status: Married    Spouse Name: N/A  . Number of Children: N/A  . Years of Education: N/A   Occupational History  . Not on file.   Social History Main Topics  . Smoking status: Current Every Day Smoker -- 0.25 packs/day for 15 years    Types: Cigarettes  . Smokeless tobacco: Never Used     Comment: 4-5 cigarettes a day  . Alcohol Use: No     Comment: rarely  . Drug Use: No  . Sexual Activity: Yes   Other Topics Concern  . Not on file   Social History Narrative    Past Surgical History  Procedure Laterality Date  . Back surgery    . Back surgery  07/2012    lumbar spinal fusion L4 Danielle Dess- Elsner, MD    No family history on file.  No Known Allergies  Current Outpatient Prescriptions on File Prior to Visit  Medication Sig Dispense Refill  . amLODipine (NORVASC) 5 MG tablet TAKE 1 TABLET ONCE DAILY. 90  tablet 3  . hydrochlorothiazide (HYDRODIURIL) 25 MG tablet TAKE 1 TABLET ONCE DAILY. 90 tablet 3  . ibuprofen (ADVIL,MOTRIN) 200 MG tablet Take 800 mg by mouth every 6 (six) hours as needed (pain.).    Marland Kitchen. MELATONIN PO Take 1 tablet by mouth at bedtime as needed.     . naproxen sodium (ANAPROX) 220 MG tablet Take 220 mg by mouth 2 (two) times daily as needed (for pain).    . Norethindrone Acetate-Ethinyl Estradiol (JUNEL,LOESTRIN,MICROGESTIN) 1.5-30 MG-MCG tablet Take 1 tablet by mouth daily.    . ramipril (ALTACE) 10 MG capsule TAKE (1) CAPSULE DAILY. 90 capsule 3  . HYDROcodone-homatropine (HYCODAN) 5-1.5 MG/5ML syrup Take 5 mLs by mouth at bedtime as needed for cough. (Patient not taking: Reported on 06/20/2015) 120 mL 0   No current facility-administered medications on file prior to visit.    BP 104/78 mmHg  Temp(Src) 98.7 F (37.1 C) (Oral)  Ht 5' 3.5" (1.613 m)  Wt 127 lb 12.8 oz (57.97 kg)  BMI 22.28 kg/m2  LMP 06/15/2015       Objective:   Physical Exam  Constitutional: She is oriented to person, place, and time. She appears well-developed and well-nourished. No distress.  HENT:  Head: Normocephalic and atraumatic.  Right Ear: External ear normal.  Left Ear:  External ear normal.  Nose: Nose normal.  Mouth/Throat: No oropharyngeal exudate.  Neck: Normal range of motion. Neck supple.  Cardiovascular: Normal rate, regular rhythm, normal heart sounds and intact distal pulses.  Exam reveals no gallop and no friction rub.   No murmur heard. Pulmonary/Chest: Effort normal and breath sounds normal. No respiratory distress. She has no wheezes. She has no rales. She exhibits no tenderness.  Lymphadenopathy:    She has no cervical adenopathy.  Neurological: She is alert and oriented to person, place, and time.  Skin: Skin is warm and dry. No rash noted. She is not diaphoretic. No erythema. No pallor.  Psychiatric: She has a normal mood and affect. Her behavior is normal. Judgment  and thought content normal.  Nursing note and vitals reviewed.      Assessment & Plan:  1. Acute maxillary sinusitis, recurrence not specified - Will treat due to duration of symptoms - doxycycline (VIBRAMYCIN) 100 MG capsule; Take 1 capsule (100 mg total) by mouth 2 (two) times daily.  Dispense: 20 capsule; Refill: 0 - Follow up if no improvement

## 2015-06-20 NOTE — Patient Instructions (Addendum)
It was great meeting you today!  Your exam is consistent with Sinusitis. I have sent in a prescription for Doxycycline 100mg , take this twice a day for 10 days.   If you do not notice a difference in the next 2-3 days, please let me know.    Sinusitis, Adult Sinusitis is redness, soreness, and inflammation of the paranasal sinuses. Paranasal sinuses are air pockets within the bones of your face. They are located beneath your eyes, in the middle of your forehead, and above your eyes. In healthy paranasal sinuses, mucus is able to drain out, and air is able to circulate through them by way of your nose. However, when your paranasal sinuses are inflamed, mucus and air can become trapped. This can allow bacteria and other germs to grow and cause infection. Sinusitis can develop quickly and last only a short time (acute) or continue over a long period (chronic). Sinusitis that lasts for more than 12 weeks is considered chronic. CAUSES Causes of sinusitis include:  Allergies.  Structural abnormalities, such as displacement of the cartilage that separates your nostrils (deviated septum), which can decrease the air flow through your nose and sinuses and affect sinus drainage.  Functional abnormalities, such as when the small hairs (cilia) that line your sinuses and help remove mucus do not work properly or are not present. SIGNS AND SYMPTOMS Symptoms of acute and chronic sinusitis are the same. The primary symptoms are pain and pressure around the affected sinuses. Other symptoms include:  Upper toothache.  Earache.  Headache.  Bad breath.  Decreased sense of smell and taste.  A cough, which worsens when you are lying flat.  Fatigue.  Fever.  Thick drainage from your nose, which often is green and may contain pus (purulent).  Swelling and warmth over the affected sinuses. DIAGNOSIS Your health care provider will perform a physical exam. During your exam, your health care provider may  perform any of the following to help determine if you have acute sinusitis or chronic sinusitis:  Look in your nose for signs of abnormal growths in your nostrils (nasal polyps).  Tap over the affected sinus to check for signs of infection.  View the inside of your sinuses using an imaging device that has a light attached (endoscope). If your health care provider suspects that you have chronic sinusitis, one or more of the following tests may be recommended:  Allergy tests.  Nasal culture. A sample of mucus is taken from your nose, sent to a lab, and screened for bacteria.  Nasal cytology. A sample of mucus is taken from your nose and examined by your health care provider to determine if your sinusitis is related to an allergy. TREATMENT Most cases of acute sinusitis are related to a viral infection and will resolve on their own within 10 days. Sometimes, medicines are prescribed to help relieve symptoms of both acute and chronic sinusitis. These may include pain medicines, decongestants, nasal steroid sprays, or saline sprays. However, for sinusitis related to a bacterial infection, your health care provider will prescribe antibiotic medicines. These are medicines that will help kill the bacteria causing the infection. Rarely, sinusitis is caused by a fungal infection. In these cases, your health care provider will prescribe antifungal medicine. For some cases of chronic sinusitis, surgery is needed. Generally, these are cases in which sinusitis recurs more than 3 times per year, despite other treatments. HOME CARE INSTRUCTIONS  Drink plenty of water. Water helps thin the mucus so your sinuses can drain  more easily.  Use a humidifier.  Inhale steam 3-4 times a day (for example, sit in the bathroom with the shower running).  Apply a warm, moist washcloth to your face 3-4 times a day, or as directed by your health care provider.  Use saline nasal sprays to help moisten and clean your  sinuses.  Take medicines only as directed by your health care provider.  If you were prescribed either an antibiotic or antifungal medicine, finish it all even if you start to feel better. SEEK IMMEDIATE MEDICAL CARE IF:  You have increasing pain or severe headaches.  You have nausea, vomiting, or drowsiness.  You have swelling around your face.  You have vision problems.  You have a stiff neck.  You have difficulty breathing.   This information is not intended to replace advice given to you by your health care provider. Make sure you discuss any questions you have with your health care provider.   Document Released: 06/28/2005 Document Revised: 07/19/2014 Document Reviewed: 07/13/2011 Elsevier Interactive Patient Education [employer].

## 2015-06-20 NOTE — Progress Notes (Signed)
Pre visit review using our clinic review tool, if applicable. No additional management support is needed unless otherwise documented below in the visit note. 

## 2015-07-02 ENCOUNTER — Telehealth: Payer: Self-pay | Admitting: Internal Medicine

## 2015-07-02 NOTE — Telephone Encounter (Signed)
Pt said she saw Kandee KeenCory and call today to say  she still has congestion and phelm and is asking if there is something else that can be rx for her   [facility]

## 2015-07-03 ENCOUNTER — Other Ambulatory Visit: Payer: Self-pay | Admitting: Adult Health

## 2015-07-03 MED ORDER — AMOXICILLIN-POT CLAVULANATE 875-125 MG PO TABS: 1.0000 | ORAL_TABLET | Freq: Two times a day (BID) | ORAL | Status: DC

## 2015-07-03 MED ORDER — METHYLPREDNISOLONE 4 MG PO TBPK: ORAL_TABLET | ORAL | Status: DC

## 2015-07-03 NOTE — Telephone Encounter (Signed)
Left a detailed message for pt that [facility]

## 2015-07-03 NOTE — Telephone Encounter (Signed)
Left a message for pt to return call 

## 2015-07-03 NOTE — Telephone Encounter (Signed)
Called in Augmentin, twice a day for 7 days and a Medrol Dose pack

## 2015-07-03 NOTE — Telephone Encounter (Signed)
Spoke with pt and pt states she does not have a fever.  Pt states the congestion is in her face, there is very little in her chest.  Pt denies wheezing or SOB.  Pls advise.

## 2015-07-03 NOTE — Telephone Encounter (Signed)
Does she have a fever? Continuing to have sinus pain and pressure or is it mostly still in the chest? Does she feel like she is wheezing or SOB?

## 2015-10-06 ENCOUNTER — Other Ambulatory Visit: Payer: Self-pay | Admitting: Internal Medicine

## 2015-10-13 ENCOUNTER — Encounter: Payer: Self-pay | Admitting: Internal Medicine

## 2015-10-13 ENCOUNTER — Ambulatory Visit (INDEPENDENT_AMBULATORY_CARE_PROVIDER_SITE_OTHER): Payer: BLUE CROSS/BLUE SHIELD | Admitting: Internal Medicine

## 2015-10-13 VITALS — BP 118/76 | HR 100 | Temp 98.9°F | Resp 20 | Ht 63.5 in | Wt 125.0 lb

## 2015-10-13 DIAGNOSIS — Z23 Encounter for immunization: Secondary | ICD-10-CM

## 2015-10-13 DIAGNOSIS — I10 Essential (primary) hypertension: Secondary | ICD-10-CM

## 2015-10-13 NOTE — Progress Notes (Signed)
Pre visit review using our clinic review tool, if applicable. No additional management support is needed unless otherwise documented below in the visit note. 

## 2015-10-13 NOTE — Patient Instructions (Signed)
Please check your blood pressure on a regular basis.  If it is consistently greater than 150/90, please make an office appointment.    It is important that you exercise regularly, at least 20 minutes 3 to 4 times per week.  If you develop chest pain or shortness of breath seek  medical attention.  Return in one year for follow-up 

## 2015-10-13 NOTE — Progress Notes (Signed)
Subjective:    Patient ID: [name], female    DOB: [birth year], 40 y.o.   MRN: [id number]  HPI BP Readings from Last 3 Encounters:  10/13/15 118/76  06/20/15 104/78  10/02/14 111/81   Wt Readings from Last 3 Encounters:  10/13/15 125 lb (56.7 kg)  06/20/15 127 lb 12.8 oz (57.97 kg)  10/02/14 138 lb (62.41 kg)   40 year old patient who is seen today for follow-up of hypertension.  Blood pressure has been very well controlled.  There is been some modest weight loss over the past year. Home blood pressure readings are generally in a low-normal range.  No hypotensive symptoms. No concerns or complaints. Past Medical History  Diagnosis Date  . Hypertension   . Compression fracture of lumbar vertebra [facility])     Social History   Social History  . Marital Status: Married    Spouse Name: N/A  . Number of Children: N/A  . Years of Education: N/A   Occupational History  . Not on file.   Social History Main Topics  . Smoking status: Current Every Day Smoker -- 0.25 packs/day for 15 years    Types: Cigarettes  . Smokeless tobacco: Never Used     Comment: 4-5 cigarettes a day  . Alcohol Use: No     Comment: rarely  . Drug Use: No  . Sexual Activity: Yes   Other Topics Concern  . Not on file   Social History Narrative    Past Surgical History  Procedure Laterality Date  . Back surgery    . Back surgery  07/2012    lumbar spinal fusion L4 Danielle Dess, MD    No family history on file.  No Known Allergies  Current Outpatient Prescriptions on File Prior to Visit  Medication Sig Dispense Refill  . amLODipine (NORVASC) 5 MG tablet TAKE 1 TABLET ONCE DAILY. 90 tablet 0  . hydrochlorothiazide (HYDRODIURIL) 25 MG tablet TAKE 1 TABLET ONCE DAILY. 90 tablet 0  . ibuprofen (ADVIL,MOTRIN) 200 MG tablet Take 800 mg by mouth every 6 (six) hours as needed (pain.).    Marland Kitchen MELATONIN PO Take 1 tablet by mouth at bedtime as needed.     . Norethindrone Acetate-Ethinyl Estradiol  (JUNEL,LOESTRIN,MICROGESTIN) 1.5-30 MG-MCG tablet Take 1 tablet by mouth daily.    . ramipril (ALTACE) 10 MG capsule TAKE (1) CAPSULE DAILY. 90 capsule 0   No current facility-administered medications on file prior to visit.    BP 118/76 mmHg  Pulse 100  Temp(Src) 98.9 F (37.2 C) (Oral)  Resp 20  Ht 5' 3.5" (1.613 m)  Wt 125 lb (56.7 kg)  BMI 21.79 kg/m2  LMP 10/06/2015       Review of Systems  Constitutional: Negative.   HENT: Negative for congestion, dental problem, hearing loss, rhinorrhea, sinus pressure, sore throat and tinnitus.   Eyes: Negative for pain, discharge and visual disturbance.  Respiratory: Negative for cough and shortness of breath.   Cardiovascular: Negative for chest pain, palpitations and leg swelling.  Gastrointestinal: Negative for nausea, vomiting, abdominal pain, diarrhea, constipation, blood in stool and abdominal distention.  Genitourinary: Negative for dysuria, urgency, frequency, hematuria, flank pain, vaginal bleeding, vaginal discharge, difficulty urinating, vaginal pain and pelvic pain.  Musculoskeletal: Negative for joint swelling, arthralgias and gait problem.  Skin: Negative for rash.  Neurological: Negative for dizziness, syncope, speech difficulty, weakness, numbness and headaches.  Hematological: Negative for adenopathy.  Psychiatric/Behavioral: Negative for behavioral problems, dysphoric mood and agitation. The patient is  not nervous/anxious.        Objective:   Physical Exam  Constitutional: She is oriented to person, place, and time. She appears well-developed and well-nourished.  Blood pressure 120/78  HENT:  Head: Normocephalic.  Right Ear: External ear normal.  Left Ear: External ear normal.  Mouth/Throat: Oropharynx is clear and moist.  Eyes: Conjunctivae and EOM are normal. Pupils are equal, round, and reactive to light.  Neck: Normal range of motion. Neck supple. No thyromegaly present.  Cardiovascular: Normal rate,  regular rhythm, normal heart sounds and intact distal pulses.   Pulmonary/Chest: Effort normal and breath sounds normal.  Abdominal: Soft. Bowel sounds are normal. She exhibits no mass. There is no tenderness.  Musculoskeletal: Normal range of motion.  Lymphadenopathy:    She has no cervical adenopathy.  Neurological: She is alert and oriented to person, place, and time.  Skin: Skin is warm and dry. No rash noted.  Psychiatric: She has a normal mood and affect. Her behavior is normal.          Assessment & Plan:  Hypertension, well-controlled.  Continue restricted salt diet, exercise regimen at home blood pressure monitoring  Medicines updated CPX one year

## 2016-01-06 ENCOUNTER — Other Ambulatory Visit: Payer: Self-pay | Admitting: Internal Medicine

## 2016-04-08 ENCOUNTER — Other Ambulatory Visit: Payer: Self-pay | Admitting: Internal Medicine

## 2016-05-24 DIAGNOSIS — Z01419 Encounter for gynecological examination (general) (routine) without abnormal findings: Secondary | ICD-10-CM | POA: Diagnosis not present

## 2016-05-24 DIAGNOSIS — Z3041 Encounter for surveillance of contraceptive pills: Secondary | ICD-10-CM | POA: Diagnosis not present

## 2016-05-24 DIAGNOSIS — Z1389 Encounter for screening for other disorder: Secondary | ICD-10-CM | POA: Diagnosis not present

## 2016-05-24 DIAGNOSIS — Z6826 Body mass index (BMI) 26.0-26.9, adult: Secondary | ICD-10-CM | POA: Diagnosis not present

## 2016-05-24 DIAGNOSIS — Z3202 Encounter for pregnancy test, result negative: Secondary | ICD-10-CM | POA: Diagnosis not present

## 2016-05-24 DIAGNOSIS — Z130 Encounter for screening for diseases of the blood and blood-forming organs and certain disorders involving the immune mechanism: Secondary | ICD-10-CM | POA: Diagnosis not present

## 2016-09-16 ENCOUNTER — Encounter: Payer: Self-pay | Admitting: Internal Medicine

## 2016-09-16 ENCOUNTER — Ambulatory Visit (INDEPENDENT_AMBULATORY_CARE_PROVIDER_SITE_OTHER): Payer: BLUE CROSS/BLUE SHIELD | Admitting: Internal Medicine

## 2016-09-16 VITALS — BP 142/72 | HR 104 | Temp 98.3°F | Ht 62.0 in | Wt 136.6 lb

## 2016-09-16 DIAGNOSIS — Z0000 Encounter for general adult medical examination without abnormal findings: Secondary | ICD-10-CM

## 2016-09-16 LAB — LIPID PANEL
CHOL/HDL RATIO: 3
Cholesterol: 175 mg/dL (ref 0–200)
HDL: 64.3 mg/dL (ref >39.00)
LDL CALC: 93 mg/dL (ref 0–99)
NONHDL: 110.85
Triglycerides: 89 mg/dL (ref 0.0–149.0)
VLDL: 17.8 mg/dL (ref 0.0–40.0)

## 2016-09-16 LAB — COMPREHENSIVE METABOLIC PANEL
ALK PHOS: 52 U/L (ref 39–117)
ALT: 13 U/L (ref 0–35)
AST: 19 U/L (ref 0–37)
Albumin: 4.8 g/dL (ref 3.5–5.2)
BUN: 14 mg/dL (ref 6–23)
CHLORIDE: 102 meq/L (ref 96–112)
CO2: 29 meq/L (ref 19–32)
Calcium: 9.9 mg/dL (ref 8.4–10.5)
Creatinine, Ser: 0.69 mg/dL (ref 0.40–1.20)
GFR: 100.68 mL/min (ref >60.00)
GLUCOSE: 79 mg/dL (ref 70–99)
POTASSIUM: 3.6 meq/L (ref 3.5–5.1)
SODIUM: 137 meq/L (ref 135–145)
TOTAL PROTEIN: 7.3 g/dL (ref 6.0–8.3)
Total Bilirubin: 0.9 mg/dL (ref 0.2–1.2)

## 2016-09-16 LAB — TSH: TSH: 0.77 u[IU]/mL (ref 0.35–4.50)

## 2016-09-16 LAB — CBC WITH DIFFERENTIAL/PLATELET
BASOS ABS: 0 10*3/uL (ref 0.0–0.1)
Basophils Relative: 0.3 % (ref 0.0–3.0)
EOS PCT: 6.2 % — AB (ref 0.0–5.0)
Eosinophils Absolute: 0.8 10*3/uL — ABNORMAL HIGH (ref 0.0–0.7)
HEMATOCRIT: 40.2 % (ref 36.0–46.0)
Hemoglobin: 13.7 g/dL (ref 12.0–15.0)
LYMPHS PCT: 24.2 % (ref 12.0–46.0)
Lymphs Abs: 3 10*3/uL (ref 0.7–4.0)
MCHC: 34.2 g/dL (ref 30.0–36.0)
MCV: 94.4 fl (ref 78.0–100.0)
MONOS PCT: 5.3 % (ref 3.0–12.0)
Monocytes Absolute: 0.7 10*3/uL (ref 0.1–1.0)
NEUTROS ABS: 8 10*3/uL — AB (ref 1.4–7.7)
Neutrophils Relative %: 64 % (ref 43.0–77.0)
PLATELETS: 293 10*3/uL (ref 150.0–400.0)
RBC: 4.26 Mil/uL (ref 3.87–5.11)
RDW: 13.2 % (ref 11.5–15.5)
WBC: 12.5 10*3/uL — ABNORMAL HIGH (ref 4.0–10.5)

## 2016-09-16 MED ORDER — HYDROCHLOROTHIAZIDE 25 MG PO TABS: 25.0000 mg | ORAL_TABLET | Freq: Every day | ORAL | 1 refills | Status: DC

## 2016-09-16 MED ORDER — NORETHINDRONE ACET-ETHINYL EST 1.5-30 MG-MCG PO TABS: 1.0000 | ORAL_TABLET | Freq: Every day | ORAL | 2 refills | Status: DC

## 2016-09-16 MED ORDER — RAMIPRIL 10 MG PO CAPS: ORAL_CAPSULE | ORAL | 1 refills | Status: DC

## 2016-09-16 MED ORDER — NORETHINDRONE ACET-ETHINYL EST 1.5-30 MG-MCG PO TABS
1.0000 | ORAL_TABLET | Freq: Every day | ORAL | 2 refills | Status: DC
Start: 2016-09-16 — End: 2017-06-21

## 2016-09-16 NOTE — Progress Notes (Signed)
Pre visit review using our clinic review tool, if applicable. No additional management support is needed unless otherwise documented below in the visit note. 

## 2016-09-16 NOTE — Progress Notes (Signed)
Subjective:    Patient ID: [name], female    DOB: October 15, 1975, 41 y.o.   MRN: [id number]  HPI 41 year old patient who is seen today for a wellness exam.  She has a history of essential hypertension. Gravida 2, para 2 abortus 0.  On birth control pills No plans for further pregnancies.  Antihypertensive regimen includes Ace inhibition  Exercise is regular with a personal trainer   Past Medical History:  Diagnosis Date  . Compression fracture of lumbar vertebra (HCC)   . Hypertension      Social History   Social History  . Marital status: Married    Spouse name: N/A  . Number of children: N/A  . Years of education: N/A   Occupational History  . Not on file.   Social History Main Topics  . Smoking status: Current Every Day Smoker    Packs/day: 0.25    Years: 15.00    Types: Cigarettes  . Smokeless tobacco: Never Used     Comment: 4-5 cigarettes a day  . Alcohol use No     Comment: rarely  . Drug use: No  . Sexual activity: Yes   Other Topics Concern  . Not on file   Social History Narrative  . No narrative on file    Past Surgical History:  Procedure Laterality Date  . BACK SURGERY    . BACK SURGERY  07/2012   lumbar spinal fusion L4 Danielle Dess, MD    No family history on file.  No Known Allergies  Current Outpatient Prescriptions on File Prior to Visit  Medication Sig Dispense Refill  . ibuprofen (ADVIL,MOTRIN) 200 MG tablet Take 800 mg by mouth every 6 (six) hours as needed (pain.).     No current facility-administered medications on file prior to visit.     BP (!) 142/72 (BP Location: Left Arm, Patient Position: Sitting, Cuff Size: Normal)   Pulse (!) 104   Temp 98.3 F (36.8 C) (Oral)   Ht 5\' 2"  (1.575 m)   Wt 136 lb 9.6 oz (62 kg)   SpO2 96%   BMI 24.98 kg/m    Review of Systems  Constitutional: Negative.   HENT: Negative for congestion, dental problem, hearing loss, rhinorrhea, sinus pressure, sore throat and tinnitus.     Eyes: Negative for pain, discharge and visual disturbance.  Respiratory: Negative for cough and shortness of breath.   Cardiovascular: Negative for chest pain, palpitations and leg swelling.  Gastrointestinal: Negative for abdominal distention, abdominal pain, blood in stool, constipation, diarrhea, nausea and vomiting.  Genitourinary: Negative for difficulty urinating, dysuria, flank pain, frequency, hematuria, pelvic pain, urgency, vaginal bleeding, vaginal discharge and vaginal pain.  Musculoskeletal: Negative for arthralgias, gait problem and joint swelling.  Skin: Negative for rash.  Neurological: Negative for dizziness, syncope, speech difficulty, weakness, numbness and headaches.  Hematological: Negative for adenopathy.  Psychiatric/Behavioral: Negative for agitation, behavioral problems and dysphoric mood. The patient is not nervous/anxious.        Objective:   Physical Exam  Constitutional: She is oriented to person, place, and time. She appears well-developed and well-nourished.  HENT:  Head: Normocephalic and atraumatic.  Right Ear: External ear normal.  Left Ear: External ear normal.  Mouth/Throat: Oropharynx is clear and moist.  Eyes: Conjunctivae and EOM are normal.  Neck: Normal range of motion. Neck supple. No JVD present. No thyromegaly present.  Cardiovascular: Normal rate, regular rhythm, normal heart sounds and intact distal pulses.   No murmur heard. Pulmonary/Chest:  Effort normal and breath sounds normal. She has no wheezes. She has no rales.  Abdominal: Soft. Bowel sounds are normal. She exhibits no distension and no mass. There is no tenderness. There is no rebound and no guarding.  Musculoskeletal: Normal range of motion. She exhibits no edema or tenderness.  Neurological: She is alert and oriented to person, place, and time. She has normal reflexes. No cranial nerve deficit. She exhibits normal muscle tone. Coordination normal.  Skin: Skin is warm and dry. No  rash noted.  Psychiatric: She has a normal mood and affect. Her behavior is normal.          Assessment & Plan:  Preventive health exam Hypertension.  Well-controlled.  No change in therapy.  It was reemphasized that ramipril must be discontinued if patient were to consider further pregnancies in the future  We'll check updated lab Continue home blood pressure monitoring and active lifestyle  Follow-up one year or as necessary  Rogelia BogaKWIATKOWSKI,Sareen Randon FRANK

## 2016-09-16 NOTE — Patient Instructions (Addendum)
Limit your sodium (Salt) intake  Please check your blood pressure on a regular basis.  If it is consistently greater than 150/90, please make an office appointment.  Follow-up OB/GYN as scheduled  NOTE:  If you stop birth control pills, you must also stop ramipril.  You  cannot take ramipril if there is a chance of becoming pregnant

## 2016-12-08 ENCOUNTER — Other Ambulatory Visit: Payer: Self-pay | Admitting: Internal Medicine

## 2017-01-04 ENCOUNTER — Other Ambulatory Visit: Payer: Self-pay | Admitting: Internal Medicine

## 2017-02-03 ENCOUNTER — Encounter: Payer: Self-pay | Admitting: Family Medicine

## 2017-02-03 ENCOUNTER — Ambulatory Visit (INDEPENDENT_AMBULATORY_CARE_PROVIDER_SITE_OTHER): Payer: BLUE CROSS/BLUE SHIELD | Admitting: Family Medicine

## 2017-02-03 VITALS — BP 102/82 | HR 93 | Temp 98.8°F | Ht 62.0 in | Wt 134.0 lb

## 2017-02-03 DIAGNOSIS — J0100 Acute maxillary sinusitis, unspecified: Secondary | ICD-10-CM

## 2017-02-03 MED ORDER — AMOXICILLIN-POT CLAVULANATE 875-125 MG PO TABS: 1.0000 | ORAL_TABLET | Freq: Two times a day (BID) | ORAL | 0 refills | Status: DC

## 2017-02-03 NOTE — Progress Notes (Signed)
HPI:  Acute visit for sinus congestion: -started: 2 weeks ago and worsening -symptoms:nasal congestion -thick yellow and green mucus, sore throat, cough, sinus pressure and pain, upper tooth pain, right ear pressure and discomfort -denies:fever, SOB, NVD -has tried: Over-the-counter options of nasal decongestant, Nettie pot -sick contacts/travel/risks: no reported flu, strep or tick exposure   ROS: See pertinent positives and negatives per HPI.  Past Medical History:  Diagnosis Date  . Compression fracture of lumbar vertebra (HCC)   . Hypertension     Past Surgical History:  Procedure Laterality Date  . BACK SURGERY    . BACK SURGERY  07/2012   lumbar spinal fusion L4 Danielle Dess- Elsner, MD    No family history on file.  Social History   Social History  . Marital status: Married    Spouse name: N/A  . Number of children: N/A  . Years of education: N/A   Social History Main Topics  . Smoking status: Current Every Day Smoker    Packs/day: 0.25    Years: 15.00    Types: Cigarettes  . Smokeless tobacco: Never Used     Comment: 4-5 cigarettes a day  . Alcohol use No     Comment: rarely  . Drug use: No  . Sexual activity: Yes   Other Topics Concern  . None   Social History Narrative  . None     Current Outpatient Prescriptions:  .  hydrochlorothiazide (HYDRODIURIL) 25 MG tablet, Take 1 tablet (25 mg total) by mouth daily., Disp: 90 tablet, Rfl: 1 .  ibuprofen (ADVIL,MOTRIN) 200 MG tablet, Take 800 mg by mouth every 6 (six) hours as needed (pain.)., Disp: , Rfl:  .  JUNEL FE 1.5/30 1.5-30 MG-MCG tablet, TAKE 1 TABLET EACH DAY., Disp: 84 tablet, Rfl: 0 .  Norethindrone Acetate-Ethinyl Estradiol (JUNEL,LOESTRIN,MICROGESTIN) 1.5-30 MG-MCG tablet, Take 1 tablet by mouth daily., Disp: 1 Package, Rfl: 2 .  ramipril (ALTACE) 10 MG capsule, TAKE (1) CAPSULE DAILY., Disp: 90 capsule, Rfl: 1 .  amoxicillin-clavulanate (AUGMENTIN) 875-125 MG tablet, Take 1 tablet by mouth 2 (two)  times daily., Disp: 14 tablet, Rfl: 0  EXAM:  Vitals:   02/03/17 1525  BP: 102/82  Pulse: 93  Temp: 98.8 F (37.1 C)    Body mass index is 24.51 kg/m.  GENERAL: vitals reviewed and listed above, alert, oriented, appears well hydrated and in no acute distress  HEENT: atraumatic, conjunttiva clear, no obvious abnormalities on inspection of external nose and ears, normal appearance of ear canals and TMs Except for clear effusions bilaterally with mildly red TM on the right with retraction, thick white and yellow nasal congestion, mild post oropharyngeal erythema with PND, no tonsillar edema or exudate, maxillary sinus TTP  NECK: no obvious masses on inspection  LUNGS: clear to auscultation bilaterally, no wheezes, rales or rhonchi, good air movement  CV: HRRR, no peripheral edema  MS: moves all extremities without noticeable abnormality  PSYCH: pleasant and cooperative, no obvious depression or anxiety  ASSESSMENT AND PLAN:  Discussed the following assessment and plan:  Acute non-recurrent maxillary sinusitis  -We discussed potential etiologies, with sinusitis and possible developing right otitis media being most likely. Opted to treat with Augmentin. We discussed treatment side effects, likely course, antibiotic misuse, transmission, and signs of developing a serious illness. -of course, we advised to return or notify a doctor immediately if symptoms worsen or persist or new concerns arise. Declined AVS.   There are no Patient Instructions on file for this visit.  Colin Benton R., DO

## 2017-03-29 ENCOUNTER — Other Ambulatory Visit: Payer: Self-pay | Admitting: Internal Medicine

## 2017-04-12 ENCOUNTER — Other Ambulatory Visit: Payer: Self-pay | Admitting: Internal Medicine

## 2017-05-26 DIAGNOSIS — H01009 Unspecified blepharitis unspecified eye, unspecified eyelid: Secondary | ICD-10-CM | POA: Diagnosis not present

## 2017-05-26 DIAGNOSIS — H0015 Chalazion left lower eyelid: Secondary | ICD-10-CM | POA: Diagnosis not present

## 2017-06-10 DIAGNOSIS — H0012 Chalazion right lower eyelid: Secondary | ICD-10-CM | POA: Diagnosis not present

## 2017-06-10 DIAGNOSIS — H01009 Unspecified blepharitis unspecified eye, unspecified eyelid: Secondary | ICD-10-CM | POA: Diagnosis not present

## 2017-06-10 DIAGNOSIS — H0015 Chalazion left lower eyelid: Secondary | ICD-10-CM | POA: Diagnosis not present

## 2017-06-21 ENCOUNTER — Other Ambulatory Visit: Payer: Self-pay | Admitting: Internal Medicine

## 2017-07-14 ENCOUNTER — Other Ambulatory Visit: Payer: Self-pay | Admitting: Internal Medicine

## 2017-09-13 ENCOUNTER — Other Ambulatory Visit: Payer: Self-pay | Admitting: Internal Medicine

## 2017-10-13 ENCOUNTER — Other Ambulatory Visit: Payer: Self-pay | Admitting: Internal Medicine

## 2017-11-10 ENCOUNTER — Ambulatory Visit: Payer: BLUE CROSS/BLUE SHIELD | Admitting: Internal Medicine

## 2017-11-10 ENCOUNTER — Other Ambulatory Visit: Payer: Self-pay

## 2017-11-10 ENCOUNTER — Encounter: Payer: Self-pay | Admitting: Internal Medicine

## 2017-11-10 VITALS — BP 120/80 | HR 101 | Temp 98.9°F | Wt 139.0 lb

## 2017-11-10 DIAGNOSIS — I10 Essential (primary) hypertension: Secondary | ICD-10-CM

## 2017-11-10 MED ORDER — HYDROCHLOROTHIAZIDE 25 MG PO TABS: 25.0000 mg | ORAL_TABLET | Freq: Every day | ORAL | 3 refills | Status: DC

## 2017-11-10 MED ORDER — RAMIPRIL 10 MG PO CAPS: ORAL_CAPSULE | ORAL | 3 refills | Status: DC

## 2017-11-10 NOTE — Patient Instructions (Signed)
Limit your sodium (Salt) intake  Please check your blood pressure on a regular basis.  If it is consistently greater than 130/90, please make an office appointment.  Return in one year for follow-up

## 2017-11-15 ENCOUNTER — Encounter: Payer: Self-pay | Admitting: Internal Medicine

## 2017-11-15 NOTE — Progress Notes (Signed)
Subjective:    Patient ID: [name], female    DOB: 1975/08/12, 42 y.o.   MRN: [id number]  HPI  42 year old patient who is seen today for follow-up of essential hypertension. She continues to do quite well on hydrochlorothiazide and ramipril. She remains on birth control pills and plans no further pregnancies. No concerns or complaints  Past Medical History:  Diagnosis Date  . Compression fracture of lumbar vertebra (HCC)   . Hypertension      Social History   Socioeconomic History  . Marital status: Married    Spouse name: Not on file  . Number of children: Not on file  . Years of education: Not on file  . Highest education level: Not on file  Occupational History  . Not on file  Social Needs  . Financial resource strain: Not on file  . Food insecurity:    Worry: Not on file    Inability: Not on file  . Transportation needs:    Medical: Not on file    Non-medical: Not on file  Tobacco Use  . Smoking status: Current Every Day Smoker    Packs/day: 0.25    Years: 15.00    Pack years: 3.75    Types: Cigarettes  . Smokeless tobacco: Never Used  . Tobacco comment: 4-5 cigarettes a day  Substance and Sexual Activity  . Alcohol use: No    Comment: rarely  . Drug use: No  . Sexual activity: Yes  Lifestyle  . Physical activity:    Days per week: Not on file    Minutes per session: Not on file  . Stress: Not on file  Relationships  . Social connections:    Talks on phone: Not on file    Gets together: Not on file    Attends religious service: Not on file    Active member of club or organization: Not on file    Attends meetings of clubs or organizations: Not on file    Relationship status: Not on file  . Intimate partner violence:    Fear of current or ex partner: Not on file    Emotionally abused: Not on file    Physically abused: Not on file    Forced sexual activity: Not on file  Other Topics Concern  . Not on file  Social History Narrative  .  Not on file    Past Surgical History:  Procedure Laterality Date  . BACK SURGERY    . BACK SURGERY  07/2012   lumbar spinal fusion L4 Danielle Dess, MD    History reviewed. No pertinent family history.  No Known Allergies  Current Outpatient Medications on File Prior to Visit  Medication Sig Dispense Refill  . ibuprofen (ADVIL,MOTRIN) 200 MG tablet Take 800 mg by mouth every 6 (six) hours as needed (pain.).    Marland Kitchen JUNEL FE 1.5/30 1.5-30 MG-MCG tablet TAKE 1 TABLET EACH DAY. 84 tablet 0  . JUNEL FE 1.5/30 1.5-30 MG-MCG tablet TAKE 1 TABLET EACH DAY. 84 tablet 0   No current facility-administered medications on file prior to visit.     BP 120/80 (BP Location: Right Arm, Patient Position: Sitting, Cuff Size: Normal)   Pulse (!) 101   Temp 98.9 F (37.2 C) (Oral)   Wt 139 lb (63 kg)   SpO2 100%   BMI 25.42 kg/m      Review of Systems  Constitutional: Negative.   HENT: Negative for congestion, dental problem, hearing loss, rhinorrhea, sinus pressure,  sore throat and tinnitus.   Eyes: Negative for pain, discharge and visual disturbance.  Respiratory: Negative for cough and shortness of breath.   Cardiovascular: Negative for chest pain, palpitations and leg swelling.  Gastrointestinal: Negative for abdominal distention, abdominal pain, blood in stool, constipation, diarrhea, nausea and vomiting.  Genitourinary: Negative for difficulty urinating, dysuria, flank pain, frequency, hematuria, pelvic pain, urgency, vaginal bleeding, vaginal discharge and vaginal pain.  Musculoskeletal: Negative for arthralgias, gait problem and joint swelling.  Skin: Negative for rash.  Neurological: Negative for dizziness, syncope, speech difficulty, weakness, numbness and headaches.  Hematological: Negative for adenopathy.  Psychiatric/Behavioral: Negative for agitation, behavioral problems and dysphoric mood. The patient is not nervous/anxious.        Objective:   Physical Exam  Constitutional: She  is oriented to person, place, and time. She appears well-developed and well-nourished.  HENT:  Head: Normocephalic.  Right Ear: External ear normal.  Left Ear: External ear normal.  Mouth/Throat: Oropharynx is clear and moist.  Eyes: Pupils are equal, round, and reactive to light. Conjunctivae and EOM are normal.  Neck: Normal range of motion. Neck supple. No thyromegaly present.  Cardiovascular: Normal rate, regular rhythm, normal heart sounds and intact distal pulses.  Pulmonary/Chest: Effort normal and breath sounds normal.  Abdominal: Soft. Bowel sounds are normal. She exhibits no mass. There is no tenderness.  Musculoskeletal: Normal range of motion.  Lymphadenopathy:    She has no cervical adenopathy.  Neurological: She is alert and oriented to person, place, and time.  Skin: Skin is warm and dry. No rash noted.  Psychiatric: She has a normal mood and affect. Her behavior is normal.          Assessment & Plan:   Essential hypertension well-controlled.  No change in therapy. Continue birth control pills.  Patient aware that ACE inhibitor should not be used if pregnancy considered Continue home blood pressure monitoring and low-salt diet  Follow-up 1 year or as needed  Rogelia Boga

## 2017-12-06 ENCOUNTER — Other Ambulatory Visit: Payer: Self-pay | Admitting: Internal Medicine

## 2018-01-10 ENCOUNTER — Emergency Department (HOSPITAL_COMMUNITY): Payer: BLUE CROSS/BLUE SHIELD

## 2018-01-10 ENCOUNTER — Encounter (HOSPITAL_COMMUNITY): Payer: Self-pay | Admitting: *Deleted

## 2018-01-10 ENCOUNTER — Emergency Department (HOSPITAL_COMMUNITY)
Admission: EM | Admit: 2018-01-10 | Discharge: 2018-01-10 | Disposition: A | Discharge status: Home / Self Care | Payer: BLUE CROSS/BLUE SHIELD | Attending: Emergency Medicine | Admitting: Emergency Medicine

## 2018-01-10 ENCOUNTER — Ambulatory Visit: Payer: Self-pay

## 2018-01-10 ENCOUNTER — Other Ambulatory Visit: Payer: Self-pay

## 2018-01-10 DIAGNOSIS — F17210 Nicotine dependence, cigarettes, uncomplicated: Secondary | ICD-10-CM | POA: Insufficient documentation

## 2018-01-10 DIAGNOSIS — R0602 Shortness of breath: Secondary | ICD-10-CM | POA: Diagnosis not present

## 2018-01-10 DIAGNOSIS — I10 Essential (primary) hypertension: Secondary | ICD-10-CM | POA: Insufficient documentation

## 2018-01-10 DIAGNOSIS — R079 Chest pain, unspecified: Secondary | ICD-10-CM | POA: Diagnosis not present

## 2018-01-10 DIAGNOSIS — R0789 Other chest pain: Secondary | ICD-10-CM | POA: Diagnosis not present

## 2018-01-10 DIAGNOSIS — J189 Pneumonia, unspecified organism: Secondary | ICD-10-CM

## 2018-01-10 DIAGNOSIS — Z79899 Other long term (current) drug therapy: Secondary | ICD-10-CM | POA: Insufficient documentation

## 2018-01-10 DIAGNOSIS — J181 Lobar pneumonia, unspecified organism: Secondary | ICD-10-CM | POA: Insufficient documentation

## 2018-01-10 DIAGNOSIS — R0609 Other forms of dyspnea: Secondary | ICD-10-CM | POA: Diagnosis not present

## 2018-01-10 LAB — BASIC METABOLIC PANEL
ANION GAP: 10 (ref 5–15)
BUN: 15 mg/dL (ref 6–20)
CHLORIDE: 103 mmol/L (ref 98–111)
CO2: 24 mmol/L (ref 22–32)
Calcium: 9.3 mg/dL (ref 8.9–10.3)
Creatinine, Ser: 0.7 mg/dL (ref 0.44–1.00)
GFR calc Af Amer: >60 mL/min (ref >60)
GLUCOSE: 95 mg/dL (ref 70–99)
POTASSIUM: 3.6 mmol/L (ref 3.5–5.1)
Sodium: 137 mmol/L (ref 135–145)

## 2018-01-10 LAB — CBC WITH DIFFERENTIAL/PLATELET
ABS IMMATURE GRANULOCYTES: 0.1 10*3/uL (ref 0.0–0.1)
Basophils Absolute: 0 10*3/uL (ref 0.0–0.1)
Basophils Relative: 0 %
Eosinophils Absolute: 0.2 10*3/uL (ref 0.0–0.7)
Eosinophils Relative: 2 %
HEMATOCRIT: 39 % (ref 36.0–46.0)
HEMOGLOBIN: 12.8 g/dL (ref 12.0–15.0)
IMMATURE GRANULOCYTES: 1 %
LYMPHS ABS: 1.9 10*3/uL (ref 0.7–4.0)
LYMPHS PCT: 14 %
MCH: 31.4 pg (ref 26.0–34.0)
MCHC: 32.8 g/dL (ref 30.0–36.0)
MCV: 95.8 fL (ref 78.0–100.0)
Monocytes Absolute: 0.8 10*3/uL (ref 0.1–1.0)
Monocytes Relative: 6 %
NEUTROS ABS: 10.5 10*3/uL — AB (ref 1.7–7.7)
Neutrophils Relative %: 77 %
Platelets: 265 10*3/uL (ref 150–400)
RBC: 4.07 MIL/uL (ref 3.87–5.11)
RDW: 13.1 % (ref 11.5–15.5)
WBC: 13.6 10*3/uL — AB (ref 4.0–10.5)

## 2018-01-10 LAB — I-STAT TROPONIN, ED
TROPONIN I, POC: 0 ng/mL (ref 0.00–0.08)
TROPONIN I, POC: 0 ng/mL (ref 0.00–0.08)

## 2018-01-10 LAB — I-STAT BETA HCG BLOOD, ED (MC, WL, AP ONLY): I-stat hCG, quantitative: <5 m[IU]/mL (ref <5)

## 2018-01-10 LAB — D-DIMER, QUANTITATIVE: D-Dimer, Quant: 0.39 ug/mL-FEU (ref 0.00–0.50)

## 2018-01-10 MED ORDER — IOPAMIDOL (ISOVUE-370) INJECTION 76%
100.0000 mL | Freq: Once | INTRAVENOUS | Status: AC | PRN
  Administered 2018-01-10: 100 mL via INTRAVENOUS

## 2018-01-10 MED ORDER — IOPAMIDOL (ISOVUE-370) INJECTION 76%: 100.0000 mL | Freq: Once | INTRAVENOUS | Status: DC | PRN

## 2018-01-10 MED ORDER — NAPROXEN 500 MG PO TABS: 500.0000 mg | ORAL_TABLET | Freq: Two times a day (BID) | ORAL | 0 refills | Status: DC

## 2018-01-10 MED ORDER — KETOROLAC TROMETHAMINE 30 MG/ML IJ SOLN
30.0000 mg | Freq: Once | INTRAMUSCULAR | Status: AC
  Administered 2018-01-10: 30 mg via INTRAVENOUS
  Filled 2018-01-10: qty 1

## 2018-01-10 MED ORDER — SODIUM CHLORIDE 0.9 % IV BOLUS
500.0000 mL | Freq: Once | INTRAVENOUS | Status: AC
  Administered 2018-01-10: 500 mL via INTRAVENOUS

## 2018-01-10 MED ORDER — IPRATROPIUM-ALBUTEROL 0.5-2.5 (3) MG/3ML IN SOLN
3.0000 mL | Freq: Once | RESPIRATORY_TRACT | Status: AC
  Administered 2018-01-10: 3 mL via RESPIRATORY_TRACT
  Filled 2018-01-10: qty 3

## 2018-01-10 MED ORDER — BENZONATATE 100 MG PO CAPS: 100.0000 mg | ORAL_CAPSULE | Freq: Three times a day (TID) | ORAL | 0 refills | Status: DC

## 2018-01-10 MED ORDER — DOXYCYCLINE HYCLATE 100 MG PO CAPS: 100.0000 mg | ORAL_CAPSULE | Freq: Two times a day (BID) | ORAL | 0 refills | Status: DC

## 2018-01-10 MED ORDER — ALBUTEROL SULFATE HFA 108 (90 BASE) MCG/ACT IN AERS
1.0000 | INHALATION_SPRAY | Freq: Once | RESPIRATORY_TRACT | Status: AC
  Administered 2018-01-10: 2 via RESPIRATORY_TRACT
  Filled 2018-01-10: qty 6.7

## 2018-01-10 MED ORDER — SODIUM CHLORIDE 0.9 % IV BOLUS
1000.0000 mL | Freq: Once | INTRAVENOUS | Status: AC
  Administered 2018-01-10: 1000 mL via INTRAVENOUS

## 2018-01-10 MED ORDER — MORPHINE SULFATE (PF) 4 MG/ML IV SOLN
4.0000 mg | Freq: Once | INTRAVENOUS | Status: AC
  Administered 2018-01-10: 4 mg via INTRAVENOUS
  Filled 2018-01-10: qty 1

## 2018-01-10 MED ORDER — IOPAMIDOL (ISOVUE-370) INJECTION 76%
INTRAVENOUS | Status: AC
  Filled 2018-01-10: qty 100

## 2018-01-10 NOTE — ED Notes (Signed)
Patient transported to X-ray 

## 2018-01-10 NOTE — Discharge Instructions (Signed)
Please read and follow all provided instructions.  You have been diagnosed today with Pneumonia   Tests performed today include: Blood counts and electrolytes EKG CT scan of your chest and abdomen -this showed pneumonia Chest x-ray  Vital signs. See below for your results today.   Take medications as prescribed. Please take all of your antibiotics until finished! You may develop abdominal discomfort or diarrhea from the antibiotic.  You may help offset this with probiotics which you can buy or get in yogurt. Do not eat or take the probiotics until 2 hours after your antibiotic. Do not take your medicine if develop an itchy rash, swelling in your mouth or lips, or difficulty breathing. Follow-up with your doctor in the next week in regards to your hospital visit. If you do not have a doctor use the resource guide listed below to help you find one. You may return to the emergency department if symptoms worsen, become progressive, or become more concerning.  What is Pneumonia? - Pneumonia is an infection of the lungs.  CAUSES Pneumonia may be caused by bacteria or a virus. Usually, these infections are caused by breathing infectious particles into the lungs (respiratory tract). SYMPTOMS  Cough.  Fever (>100.4) Chest pain.  Increased rate of breathing.  Wheezing.  Mucus production.  DIAGNOSIS  If you have the common symptoms of pneumonia, your caregiver will typically confirm the diagnosis with a chest X-ray. The X-ray will show an abnormality in the lung (pulmonary infiltrate) if you have pneumonia. Other tests of your blood, urine, or sputum may be done to find the specific cause of your pneumonia. Your caregiver may also do tests (blood gases or pulse oximetry) to see how well your lungs are working. TREATMENT  Some forms of pneumonia may be spread to other people when you cough or sneeze. You may be asked to wear a mask before and during your exam. Pneumonia that is caused by bacteria is  treated with antibiotic medicine. Pneumonia that is caused by the influenza virus may be treated with an antiviral medicine. Take medication as prescribed.  PREVENTION A pneumococcal shot (vaccine) is available to prevent a common bacterial cause of pneumonia. This is usually suggested for: People over 40 years old.  Patients on chemotherapy.  People with chronic lung problems, such as bronchitis or emphysema.  People with immune system problems.  If you are over 65 or have a high-risk condition, you may receive the pneumococcal vaccine if you have not received it before. A routine influenza vaccine is also recommended. This vaccine can help prevent some cases of pneumonia If you smoke, it is time to quit. You may receive instructions and counseling on how to stop smoking.  HOME CARE INSTRUCTIONS  Cough suppressants may be used if you are losing too much rest. However, coughing protects you by clearing your lungs. You should avoid using cough suppressants if you can.  Your caregiver may have prescribed medicine if he or she thinks your pneumonia is caused by a bacteria or influenza. Finish your medicine even if you start to feel better.  Your caregiver may also prescribe an expectorant. This loosens the mucus to be coughed up.  Only take over-the-counter or prescription medicines for pain, discomfort, or fever as directed by your caregiver.  Do not smoke. Smoking is a common cause of bronchitis and can contribute to pneumonia. If you are a smoker and continue to smoke, your cough may last several weeks after your pneumonia has cleared.  A  cold steam vaporizer or humidifier in your room or home may help loosen mucus.  Coughing is often worse at night. Sleeping in a semi-upright position in a recliner or using a couple pillows under your head will help with this.  Get rest as you feel it is needed. Your body will usually let you know when you need to rest.  SEEK IMMEDIATE MEDICAL CARE IF:  Your  illness becomes worse. This is especially true if you are elderly or weakened from any other disease.  You cannot control your cough with suppressants and are losing sleep.  You begin coughing up blood.  You develop pain which is getting worse or is uncontrolled with medicines.  You have a fever (100.52F).  Any of the symptoms which initially brought you in for treatment are getting worse rather than better.  You develop shortness of breath or chest pain.  Additional Information:  Your vital signs today were: BP (!) 130/93    Pulse (!) 101    Temp 98.6 F (37 C) (Oral)    Resp 19    Ht 5\' 3"  (1.6 m)    Wt 61.2 kg (135 lb)    LMP 01/07/2018    SpO2 100%    BMI 23.91 kg/m  If your blood pressure (BP) was elevated above 135/85 this visit, please have this repeated by your doctor within one month. ---------------

## 2018-01-10 NOTE — ED Triage Notes (Signed)
Pt reports CP started after Dental treatment on Monday. Pt reports CP under LT breast. Pt reports pain increases when sitting or supine. Pt reports she feels better when standing.

## 2018-01-10 NOTE — ED Notes (Signed)
Patient ambulated in the hallway, 02 saturation remained @ 96% while ambulating.

## 2018-01-10 NOTE — ED Notes (Signed)
PA at bedside at this time.  

## 2018-01-10 NOTE — Telephone Encounter (Signed)
Pt. Reports she started having chest pain and shortness of breath yesterday around 2 pm. After a dental visit. States "didn't sleep much at all" last night. Hurts worse when she takes a deep breath.Pain does not radiate. Pain is 4-7. Instructed to go to ED now for evaluation. Verbalizes understanding. Reason for Disposition . Taking a deep breath makes pain worse  Answer Assessment - Initial Assessment Questions 1. LOCATION: "Where does it hurt?"       Hurts middle and to the left 2. RADIATION: "Does the pain go anywhere else?" (e.g., into neck, jaw, arms, back)     No 3. ONSET: "When did the chest pain begin?" (Minutes, hours or days)      Yesterday after a dental visit 2pm 4. PATTERN "Does the pain come and go, or has it been constant since it started?"  "Does it get worse with exertion?"      Constant 5. DURATION: "How long does it last" (e.g., seconds, minutes, hours)     Hours 6. SEVERITY: "How bad is the pain?"  (e.g., Scale 1-10; mild, moderate, or severe)    - MILD (1-3): doesn't interfere with normal activities     - MODERATE (4-7): interferes with normal activities or awakens from sleep    - SEVERE (8-10): excruciating pain, unable to do any normal activities       4-7 7. CARDIAC RISK FACTORS: "Do you have any history of heart problems or risk factors for heart disease?" (e.g., prior heart attack, angina; high blood pressure, diabetes, being overweight, high cholesterol, smoking, or strong family history of heart disease)     HTN 8. PULMONARY RISK FACTORS: "Do you have any history of lung disease?"  (e.g., blood clots in lung, asthma, emphysema, birth control pills)     No 9. CAUSE: "What do you think is causing the chest pain?"     Unsure 10. OTHER SYMPTOMS: "Do you have any other symptoms?" (e.g., dizziness, nausea, vomiting, sweating, fever, difficulty breathing, cough)       Shortness of breath 11. PREGNANCY: "Is there any chance you are pregnant?" "When was your last  menstrual period?"       No  Protocols used: CHEST PAIN-A-AH

## 2018-01-10 NOTE — Telephone Encounter (Signed)
Monitor for ED arrival. 

## 2018-01-10 NOTE — Telephone Encounter (Signed)
Patient is currently at [facility] [facility].. 

## 2018-01-10 NOTE — ED Provider Notes (Signed)
[facility] Note   CSN: [id number] Arrival date & time: 01/10/18  [id number]     History   Chief Complaint Chief Complaint  Patient presents with  . Chest Pain    HPI [name] is a 42 y.o. female with a history of hypertension who presents emergency department today for chest pain and shortness of breath.  Patient notes that at approximately 2 PM yesterday, 01/09/18, she started developing gradual onset of substernal chest pain that radiates to the patients back (no radiation to patient's abdomen, jaw, neck, or either arm) with associated shortness of breath. Patient is unsure if radiation of pain in back is 2/2 chronic back pain. Patient notes that her pain is sharp and pleuritic in nature.  She notes that she feels she can only get 1/2 breath secondary to the pain. The pain has worsened into today and currently rates her pain level as an 8/10. She reports that the pain is not worsened by exertion but her shortness of breath is.  She notes her pain is worse when she lies down but her shortness of breath is not.  She reports she has a associated dry cough without sputum production or hemoptysis.  She denies a history of the same prior.  She reports that she did have some dental fillings at approximately 11:30 AM yesterday and is unsure if this is related.  Patient notes she has tried 800 mg ibuprofen for symptoms yesterday without any relief.  The patient is on oral contraceptives.  She denies any recent travel, immobilization, recent surgery requiring general anesthesia, trauma, or history of prior blood clots.  Patient denies fever, nasal congestion, or URI symptoms preceding her symptoms.  Patient reports that they have heart disease including her father.  She reports she does smokes juuls (tabacco).  She denies history of CHF or COPD.  She denies any illicit drug use including cocaine.  The patient denies prior MI, CVA, TIA.  She has never had an  echocardiogram, stress test, heart catheterization or cardiac work-up in the past.  There is no associated nausea, emesis or diaphoresis.  HPI  Past Medical History:  Diagnosis Date  . Compression fracture of lumbar vertebra (HCC)   . Hypertension     Patient Active Problem List   Diagnosis Date Noted  . Syncope 03/08/2013  . Headache 03/08/2013  . Normal vaginal delivery 01/29/2011  . ROM 10/27/2007  . Essential hypertension 10/27/2007    Past Surgical History:  Procedure Laterality Date  . BACK SURGERY    . BACK SURGERY  07/2012   lumbar spinal fusion L4 - Elsner, MD     OB History    Gravida  2   Para  2   Term  2   Preterm  0   AB  0   Living  2     SAB  0   TAB  0   Ectopic  0   Multiple  0   Live Births  1            Home Medications    Prior to Admission medications   Medication Sig Start Date End Date Taking? Authorizing Provider  hydrochlorothiazide (HYDRODIURIL) 25 MG tablet Take 1 tablet (25 mg total) by mouth daily. 11/10/17   Gordy SaversKwiatkowski, Peter F, MD  ibuprofen (ADVIL,MOTRIN) 200 MG tablet Take 800 mg by mouth every 6 (six) hours as needed (pain.).    [provider]  JUNEL FE 1.5/30  1.5-30 MG-MCG tablet TAKE 1 TABLET EACH DAY. 06/21/17   Gordy Savers, MD  JUNEL FE 1.5/30 1.5-30 MG-MCG tablet TAKE 1 TABLET EACH DAY. 09/15/17   Gordy Savers, MD  JUNEL FE 1.5/30 1.5-30 MG-MCG tablet TAKE 1 TABLET EACH DAY. 12/07/17   Gordy Savers, MD  ramipril (ALTACE) 10 MG capsule TAKE (1) CAPSULE DAILY. 11/10/17   Gordy Savers, MD    Family History History reviewed. No pertinent family history.  Social History Social History   Tobacco Use  . Smoking status: Current Every Day Smoker    Packs/day: 0.25    Years: 15.00    Pack years: 3.75    Types: Cigarettes  . Smokeless tobacco: Never Used  . Tobacco comment: 4-5 cigarettes a day  Substance Use Topics  . Alcohol use: No    Comment: rarely  . Drug use:  No     Allergies   Patient has no known allergies.   Review of Systems Review of Systems  All other systems reviewed and are negative.    Physical Exam Updated Vital Signs BP (!) 149/98   Pulse (!) 108   Temp 98.6 F (37 C) (Oral)   Resp (!) 24   Ht 5\' 3"  (1.6 m)   Wt 61.2 kg (135 lb)   LMP 01/07/2018   SpO2 99%   BMI 23.91 kg/m   Physical Exam  Constitutional: She appears well-developed and well-nourished.  HENT:  Head: Normocephalic and atraumatic.  Right Ear: External ear normal.  Left Ear: External ear normal.  Nose: Nose normal.  Mouth/Throat: Uvula is midline, oropharynx is clear and moist and mucous membranes are normal. No tonsillar exudate.  Eyes: Pupils are equal, round, and reactive to light. Right eye exhibits no discharge. Left eye exhibits no discharge. No scleral icterus.  Neck: Trachea normal. Neck supple. No spinous process tenderness present. No neck rigidity. Normal range of motion present.  Cardiovascular: Regular rhythm and intact distal pulses. Tachycardia present.  No murmur heard. Pulses:      Radial pulses are 2+ on the right side, and 2+ on the left side.       Dorsalis pedis pulses are 2+ on the right side, and 2+ on the left side.       Posterior tibial pulses are 2+ on the right side, and 2+ on the left side.  No lower extremity swelling or edema. Calves symmetric in size bilaterally.  Pulmonary/Chest: Breath sounds normal. Tachypnea noted. She exhibits tenderness.  Patient not able to take full breaths secondary to the pain  Abdominal: Soft. Bowel sounds are normal. There is no tenderness. There is no rigidity, no rebound, no guarding and no CVA tenderness.  Musculoskeletal: She exhibits no edema.  Lymphadenopathy:    She has no cervical adenopathy.  Neurological: She is alert.  Speech clear. Follows commands. No facial droop. PERRLA. EOM grossly intact. CN III-XII grossly intact. Grossly moves all extremities 4 without ataxia. Able  and appropriate strength for age to upper and lower extremities bilaterally.  Equal sensation to upper and lower extremities bilaterally.  Skin: Skin is warm and dry. No rash noted. She is not diaphoretic.  Psychiatric: She has a normal mood and affect.  Nursing note and vitals reviewed.    ED Treatments / Results  Labs (all labs ordered are listed, but only abnormal results are displayed) Labs Reviewed  CBC WITH DIFFERENTIAL/PLATELET - Abnormal; Notable for the following components:      Result Value  WBC 13.6 (*)    Neutro Abs 10.5 (*)    All other components within normal limits  BASIC METABOLIC PANEL  D-DIMER, QUANTITATIVE (NOT AT [facility])  I-STAT BETA HCG BLOOD, ED ([facility], [facility], [facility] ONLY)  I-STAT TROPONIN, ED  I-STAT TROPONIN, ED    EKG EKG Interpretation  Date/Time:  Tuesday January 10 2018 08:22:36 EDT Ventricular Rate:  105 PR Interval:    QRS Duration: 106 QT Interval:  361 QTC Calculation: 478 R Axis:   53 Text Interpretation:  Sinus tachycardia Ventricular premature complex Aberrant complex Biatrial enlargement Left ventricular hypertrophy ST elev, probable normal early repol pattern No significant change since last tracing Confirmed by Gwyneth Sprout ([id number]) on 01/10/2018 8:30:37 AM   Radiology Dg Chest 2 View  Result Date: 01/10/2018 CLINICAL DATA:  Left-sided chest pain.  Some shortness of breath. EXAM: CHEST - 2 VIEW COMPARISON:  07/08/2014 FINDINGS: Artifact overlies the chest. Heart size is normal. Mild tortuosity of the aorta. Mild atelectasis or scarring at the left base. The vascularity is normal. No effusions. No acute finding. IMPRESSION: Mild atelectasis or scar at the left base. Electronically Signed   By: Paulina Fusi M.D.   On: 01/10/2018 09:05   Ct Angio Chest/abd/pel For Dissection W And/or W/wo  Result Date: 01/10/2018 CLINICAL DATA:  Chest tightness and difficulty breathing EXAM: CT ANGIOGRAPHY CHEST, ABDOMEN AND PELVIS TECHNIQUE: Multidetector CT imaging  through the chest, abdomen and pelvis was performed using the standard protocol during bolus administration of intravenous contrast. Multiplanar reconstructed images and MIPs were obtained and reviewed to evaluate the vascular anatomy. CONTRAST:  ISOVUE-370 IOPAMIDOL (ISOVUE-370) INJECTION 76% COMPARISON:  Chest x-ray from earlier in the same day. FINDINGS: CTA CHEST FINDINGS Cardiovascular: Initial precontrast images show no hyperattenuating crescent within the aorta following contrast administration the aorta is well visualized. The ascending aorta measures 3.5 cm. No dissection is identified. Normal tapering is noted distally. Normal branching pattern is seen. Pulmonary artery shows a normal branching pattern without intraluminal filling defect to suggest pulmonary embolism. Heart is at the upper limits of normal in size. Mediastinum/Nodes: Thoracic inlet is within normal limits. No hilar or mediastinal adenopathy is identified. The esophagus is within normal limits. Lungs/Pleura: Lungs are well aerated bilaterally with the exception of mild bilateral lower lobe atelectasis/infiltrate. No sizable effusion is noted. Musculoskeletal: No acute abnormality noted. Review of the MIP images confirms the above findings. CTA ABDOMEN AND PELVIS FINDINGS VASCULAR Aorta: Mild atherosclerotic changes are noted without aneurysmal dilatation. No focal area of narrowing is seen. Celiac: Patent without evidence of aneurysm, dissection, vasculitis or significant stenosis. SMA: Patent without evidence of aneurysm, dissection, vasculitis or significant stenosis. Renals: Both renal arteries are patent without evidence of aneurysm, dissection, vasculitis, fibromuscular dysplasia or significant stenosis. IMA: Patent without evidence of aneurysm, dissection, vasculitis or significant stenosis. Iliacs: Patent without evidence of aneurysm, dissection, vasculitis or significant stenosis. Veins: No vein abnormality is noted. Review of  the MIP images confirms the above findings. NON-VASCULAR Hepatobiliary: Hypodensity is noted in the lateral aspect of the right lobe of the liver consistent with a small cyst. The gallbladder is within normal limits. Pancreas: Unremarkable. No pancreatic ductal dilatation or surrounding inflammatory changes. Spleen: Normal in size without focal abnormality. Adrenals/Urinary Tract: Adrenal glands are unremarkable. Kidneys are normal, without renal calculi, focal lesion, or hydronephrosis. Bladder is unremarkable. Stomach/Bowel: Stomach is within normal limits. Appendix appears normal. No evidence of bowel wall thickening, distention, or inflammatory changes. Lymphatic: No significant vascular findings are  present. No enlarged abdominal or pelvic lymph nodes. Reproductive: Uterus and bilateral adnexa are unremarkable. Other: No abdominal wall hernia or abnormality. No abdominopelvic ascites. Musculoskeletal: Postsurgical changes are noted at L4-5 Review of the MIP images confirms the above findings. IMPRESSION: No evidence of aortic abnormality or pulmonary embolism. Bilateral lower lobe atelectasis/infiltrate Electronically Signed   By: Alcide Clever M.D.   On: 01/10/2018 11:05    Procedures Procedures (including critical care time)  Medications Ordered in ED Medications  iopamidol (ISOVUE-370) 76 % injection (has no administration in time range)  iopamidol (ISOVUE-370) 76 % injection 100 mL (has no administration in time range)  albuterol (PROVENTIL HFA;VENTOLIN HFA) 108 (90 Base) MCG/ACT inhaler 1-2 puff (has no administration in time range)  sodium chloride 0.9 % bolus 1,000 mL (0 mLs Intravenous Stopped 01/10/18 1127)  morphine 4 MG/ML injection 4 mg (4 mg Intravenous Given 01/10/18 0916)  ipratropium-albuterol (DUONEB) 0.5-2.5 (3) MG/3ML nebulizer solution 3 mL (3 mLs Nebulization Given 01/10/18 0926)  iopamidol (ISOVUE-370) 76 % injection 100 mL (100 mLs Intravenous Contrast Given 01/10/18 1043)  sodium  chloride 0.9 % bolus 500 mL (0 mLs Intravenous Stopped 01/10/18 1427)  ketorolac (TORADOL) 30 MG/ML injection 30 mg (30 mg Intravenous Given 01/10/18 1432)     Initial Impression / Assessment and Plan / ED Course  I have reviewed the triage vital signs and the nursing notes.  Pertinent labs & imaging results that were available during my care of the patient were reviewed by me and considered in my medical decision making (see chart for details).     42 year old female with a history of hypertension presenting with pleuritic substernal chest pain as well as shortness of breath.  Patient reports that her chest pain is nonexertional nature not associated with radiation to her neck, jaw, either shoulder.  She denies associated nausea, emesis or diaphoresis.  Patient is noted to be tachycardic on presentation.  She is on oral contraceptives.  No concern for PE.  Will order d-dimer.  Heart is with tachycardia but regular rhythm.  Lungs are clear to auscultation bilaterally.  Will give trial of DuoNeb.  Basic labs, Tn, EKG and chest x-ray ordered. Will give IVF for tachycardia.  Patient with a leukocytosis of 13.6.  No anemia.  Pregnancy test is negative.  There is no significant electrolight derangements.  No acute kidney injury.  No anion gap acidosis.  Troponin with sinus tachycardia is within normal limits.  EKG without evidence of STEMI.  There is early re-pole pattern.  Chest x-ray is with mild atelectasis, without evidence of pneumonia, pulmonary edema, pleural effusion or pneumothorax.  D-dimer is within normal limits.  This essentially rules out PE.  After trial DuoNeb patient states no change in breathing.  She states pain now radiates to her back.  Her blood pressure is not significantly elevated I will order dissection scan to evaluate patient's chest pain not radiating to her back.  Repeat troponin pending.  Patient's pain improved after pain medication.  CT scan with bilateral lobe  atelectasis/infiltrate.  Patient does have a leukocytosis.  There is no fever in the department.  She has a smoker and reports new cough.  Will treat as acute CAP.  No risk factors for healthcare associate pneumonia.  Curb 65 score is a 0.  Patient ambulated without hypoxia.  Repeat troponin within normal limits.  Feel she is safe for discharge home.  Symptoms are atypical for ACS.  Heart score is a 2 which places the  patient at low risk.  Recommend with patient's chest pain became torsional in nature, associated nausea, diaphoresis, emesis or pain that radiates to her neck, jaw, right shoulder she should present to the emergency department for further evaluation.  I advised the patient to follow-up with PCP this week. Specific return precautions discussed. Time was given for all questions to be answered. The patient verbalized understanding and agreement with plan. The patient appears safe for discharge home.  Final Clinical Impressions(s) / ED Diagnoses   Final diagnoses:  Atypical chest pain  Shortness of breath  Community acquired pneumonia of left lower lobe of lung [facility])    ED Discharge Orders        Ordered    doxycycline (VIBRAMYCIN) 100 MG capsule  2 times daily     01/10/18 1445    benzonatate (TESSALON) 100 MG capsule  Every 8 hours     01/10/18 1446    naproxen (NAPROSYN) 500 MG tablet  2 times daily     01/10/18 1446       Princella Pellegrini 01/10/18 1503    Gwyneth Sprout, MD 01/10/18 [phone]

## 2018-02-27 ENCOUNTER — Other Ambulatory Visit: Payer: Self-pay | Admitting: Internal Medicine

## 2018-05-22 ENCOUNTER — Other Ambulatory Visit: Payer: Self-pay | Admitting: Internal Medicine

## 2018-05-22 NOTE — Telephone Encounter (Signed)
Called Sheena (flow at [facility]) she said that Dr Tawanna Cooler will be the person to put down as the authorizing provider

## 2018-05-22 NOTE — Telephone Encounter (Signed)
Left message to return phone call regarding Rx.

## 2018-05-22 NOTE — Telephone Encounter (Signed)
Copied from CRM [id number]. Topic: Quick Communication - Rx Refill/Question >> May 22, 2018  2:26 PM Jaquita Rector A wrote: Medication: JUNEL FE 1.5/30 1.5-30 MG-MCG tablet  Has the patient contacted their pharmacy? Yes.     Preferred Pharmacy (with phone number or street name): [facility] - [place], [place] - [address] [phone] (Phone) [phone] (Fax)     Agent: Please be advised that RX refills may take up to 3 business days. We ask that you follow-up with your pharmacy.

## 2018-05-23 MED ORDER — NORETHIN ACE-ETH ESTRAD-FE 1.5-30 MG-MCG PO TABS
ORAL_TABLET | ORAL | 1 refills | Status: DC
Start: 2018-05-23 — End: 2022-12-20

## 2018-08-11 DIAGNOSIS — M13852 Other specified arthritis, left hip: Secondary | ICD-10-CM | POA: Diagnosis not present

## 2018-08-11 DIAGNOSIS — M25552 Pain in left hip: Secondary | ICD-10-CM | POA: Diagnosis not present

## 2018-08-15 DIAGNOSIS — M25552 Pain in left hip: Secondary | ICD-10-CM | POA: Diagnosis not present

## 2018-08-15 DIAGNOSIS — M1632 Unilateral osteoarthritis resulting from hip dysplasia, left hip: Secondary | ICD-10-CM | POA: Diagnosis not present

## 2018-08-31 DIAGNOSIS — Z01818 Encounter for other preprocedural examination: Secondary | ICD-10-CM | POA: Diagnosis not present

## 2018-08-31 DIAGNOSIS — M25059 Hemarthrosis, unspecified hip: Secondary | ICD-10-CM | POA: Diagnosis not present

## 2018-09-06 ENCOUNTER — Ambulatory Visit: Payer: BLUE CROSS/BLUE SHIELD | Admitting: Family Medicine

## 2018-09-06 ENCOUNTER — Encounter: Payer: Self-pay | Admitting: Family Medicine

## 2018-09-06 VITALS — BP 128/90 | HR 100 | Temp 98.5°F | Wt 137.0 lb

## 2018-09-06 DIAGNOSIS — I10 Essential (primary) hypertension: Secondary | ICD-10-CM | POA: Diagnosis not present

## 2018-09-06 DIAGNOSIS — M25552 Pain in left hip: Secondary | ICD-10-CM

## 2018-09-06 NOTE — Progress Notes (Signed)
Subjective:    Patient ID: [name], female    DOB: Jun 01, 1976, 43 y.o.   MRN: [id number]  No chief complaint on file.   HPI Patient was seen today for f/u and TOC.  Pt states she is healthy and has no problems.  States she is here for form completion.  Having Total L hip replacement on Tuesday, 3/3.  Notes L hip pain x5 months, has become worse within the last 6 weeks.  States "its bone on bone".  Pt endorses having recent labs done with [facility].  Seen by Dr. Lequita Halt.  Pt denies difficulty with ADLs.    HTN: -Occasionally checks BP at home.  States typically 120/80 -Taking hydrochlorothiazide 25 mg and ramipril 10 mg. -endorses recent labs.  Last K+ in chart 3.6 on 01/10/18.  Past Medical History:  Diagnosis Date  . Compression fracture of lumbar vertebra (HCC)   . Hypertension     No Known Allergies  ROS General: Denies fever, chills, night sweats, changes in weight, changes in appetite HEENT: Denies headaches, ear pain, changes in vision, rhinorrhea, sore throat CV: Denies CP, palpitations, SOB, orthopnea Pulm: Denies SOB, cough, wheezing GI: Denies abdominal pain, nausea, vomiting, diarrhea, constipation GU: Denies dysuria, hematuria, frequency, vaginal discharge Msk: Denies muscle cramps  +L hip pain Neuro: Denies weakness, numbness, tingling Skin: Denies rashes, bruising Psych: Denies depression, anxiety, hallucinations    Objective:    Blood pressure 128/90, pulse 100, temperature 98.5 F (36.9 C), temperature source Oral, weight 137 lb (62.1 kg), SpO2 98 %.  Gen. Pleasant, well-nourished, in no distress, normal affect   HEENT: [place]/AT, face symmetric, no scleral icterus, PERRLA, nares patent without drainage, pharynx without erythema or exudate. Lungs: no accessory muscle use, CTAB, no wheezes or rales Cardiovascular: RRR, no m/r/g, no peripheral edema Musculoskeletal: No deformities, no cyanosis or clubbing, normal tone Neuro:  A&Ox3, CN II-XII intact,  normal gait Skin:  Warm, no lesions/ rash  Wt Readings from Last 3 Encounters:  09/06/18 137 lb (62.1 kg)  01/10/18 135 lb (61.2 kg)  11/10/17 139 lb (63 kg)    Lab Results  Component Value Date   WBC 13.6 (H) 01/10/2018   HGB 12.8 01/10/2018   HCT 39.0 01/10/2018   PLT 265 01/10/2018   GLUCOSE 95 01/10/2018   CHOL 175 09/16/2016   TRIG 89.0 09/16/2016   HDL 64.30 09/16/2016   LDLCALC 93 09/16/2016   ALT 13 09/16/2016   AST 19 09/16/2016   NA 137 01/10/2018   K 3.6 01/10/2018   CL 103 01/10/2018   CREATININE 0.70 01/10/2018   BUN 15 01/10/2018   CO2 24 01/10/2018   TSH 0.77 09/16/2016    Assessment/Plan:  Left hip pain -Total left hip replacement scheduled 09/12/2018 -continue f/u with [facility] -will review form to be completed. -pt advised may take 3 days for form completion.  Essential hypertension -elevated.  Will recheck -continue HCTZ 25 mg and ramipril 10 mg daily -BMP recommended.  Pt declined as had recent labs -Discussed lifestyle modification  Follow-up PRN  Abbe Amsterdam, MD

## 2018-09-09 ENCOUNTER — Encounter: Payer: Self-pay | Admitting: Family Medicine

## 2018-09-12 DIAGNOSIS — M1612 Unilateral primary osteoarthritis, left hip: Secondary | ICD-10-CM | POA: Diagnosis not present

## 2018-10-17 DIAGNOSIS — Z96642 Presence of left artificial hip joint: Secondary | ICD-10-CM | POA: Diagnosis not present

## 2018-10-17 DIAGNOSIS — Z471 Aftercare following joint replacement surgery: Secondary | ICD-10-CM | POA: Diagnosis not present

## 2018-10-17 DIAGNOSIS — M1612 Unilateral primary osteoarthritis, left hip: Secondary | ICD-10-CM | POA: Diagnosis not present

## 2018-11-06 ENCOUNTER — Other Ambulatory Visit: Payer: Self-pay | Admitting: Family Medicine

## 2018-11-06 MED ORDER — RAMIPRIL 10 MG PO CAPS: ORAL_CAPSULE | ORAL | 1 refills | Status: DC

## 2018-11-06 MED ORDER — HYDROCHLOROTHIAZIDE 25 MG PO TABS: 25.0000 mg | ORAL_TABLET | Freq: Every day | ORAL | 1 refills | Status: DC

## 2018-11-06 NOTE — Telephone Encounter (Signed)
Requested Prescriptions  Pending Prescriptions Disp Refills  . hydrochlorothiazide (HYDRODIURIL) 25 MG tablet 90 tablet 1    Sig: Take 1 tablet (25 mg total) by mouth daily.     Cardiovascular: Diuretics - Thiazide Failed - 11/06/2018 11:41 AM      Failed - Last BP in normal range    BP Readings from Last 1 Encounters:  09/06/18 128/90         Passed - Ca in normal range and within 360 days    Calcium  Date Value Ref Range Status  01/10/2018 9.3 8.9 - 10.3 mg/dL Final         Passed - Cr in normal range and within 360 days    Creatinine, Ser  Date Value Ref Range Status  01/10/2018 0.70 0.44 - 1.00 mg/dL Final         Passed - K in normal range and within 360 days    Potassium  Date Value Ref Range Status  01/10/2018 3.6 3.5 - 5.1 mmol/L Final         Passed - Na in normal range and within 360 days    Sodium  Date Value Ref Range Status  01/10/2018 137 135 - 145 mmol/L Final         Passed - Valid encounter within last 6 months    Recent Outpatient Visits          2 months ago Left hip pain   [facility], Bettey Mare, MD   12 months ago Essential hypertension   [facility], Janett Labella, MD   1 year ago Acute non-recurrent maxillary sinusitis   [facility], Damita Lack, DO   2 years ago Encounter for preventive health examination   [facility], Janett Labella, MD   3 years ago Need for prophylactic vaccination with combined diphtheria-tetanus-pertussis (DTP) vaccine   [facility], Janett Labella, MD           . ramipril (ALTACE) 10 MG capsule 90 capsule 1    Sig: TAKE (1) CAPSULE DAILY.     Cardiovascular:  ACE Inhibitors Failed - 11/06/2018 11:41 AM      Failed - Cr in normal range and within 180 days    Creatinine, Ser  Date Value Ref Range Status  01/10/2018 0.70 0.44 - 1.00 mg/dL Final         Failed - K in normal range and  within 180 days    Potassium  Date Value Ref Range Status  01/10/2018 3.6 3.5 - 5.1 mmol/L Final         Failed - Last BP in normal range    BP Readings from Last 1 Encounters:  09/06/18 128/90         Passed - Patient is not pregnant      Passed - Valid encounter within last 6 months    Recent Outpatient Visits          2 months ago Left hip pain   [facility], Bettey Mare, MD   12 months ago Essential hypertension   [facility], Janett Labella, MD   1 year ago Acute non-recurrent maxillary sinusitis   [facility], Damita Lack, DO   2 years ago Encounter for preventive health examination   [facility], Janett Labella, MD   3 years ago Need for prophylactic vaccination  with combined diphtheria-tetanus-pertussis (DTP) vaccine   [facility], Janett LabellaPeter F, MD

## 2019-01-30 DIAGNOSIS — Z6823 Body mass index (BMI) 23.0-23.9, adult: Secondary | ICD-10-CM | POA: Diagnosis not present

## 2019-01-30 DIAGNOSIS — Z01419 Encounter for gynecological examination (general) (routine) without abnormal findings: Secondary | ICD-10-CM | POA: Diagnosis not present

## 2019-02-06 DIAGNOSIS — Z1231 Encounter for screening mammogram for malignant neoplasm of breast: Secondary | ICD-10-CM | POA: Diagnosis not present

## 2019-05-21 ENCOUNTER — Other Ambulatory Visit: Payer: Self-pay | Admitting: Family Medicine

## 2019-05-21 NOTE — Telephone Encounter (Signed)
Pt needs appointment for further refills 

## 2019-06-10 IMAGING — CT CT ANGIO CHEST-ABD-PELV FOR DISSECTION W/ AND WO/W CM
2 of 7 series · 13 of 46 positions shown, 15 images · IV contrast (OMNI 350)
Comparison: Chest x-ray from earlier in the same day.

CLINICAL DATA: Chest tightness and difficulty breathing

EXAM:
CT ANGIOGRAPHY CHEST, ABDOMEN AND PELVIS
TECHNIQUE: Multidetector CT imaging through the chest, abdomen and pelvis was
performed using the standard protocol during bolus administration of
intravenous contrast. Multiplanar reconstructed images and MIPs were
obtained and reviewed to evaluate the vascular anatomy.
CONTRAST:  100mL H8EABI-09R IOPAMIDOL (H8EABI-09R) INJECTION 76%

[Series 7: dissection 2mm · axial · 0.70mm/px · z∈[+916,+1482]mm · 10 of 319 slices shown, 12 images]
[im 18/319  soft-tissue]
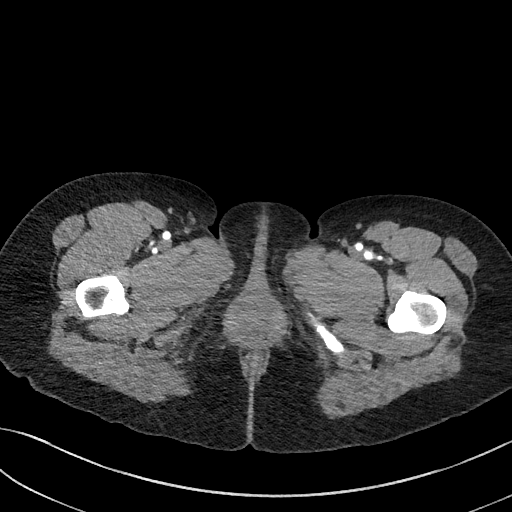
[im 18/319  bone]
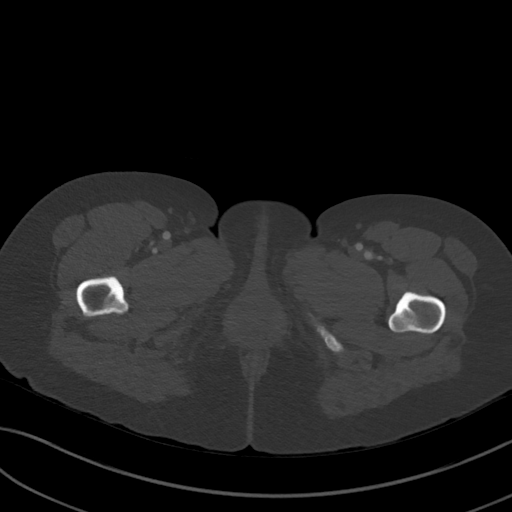
[im 54/319  soft-tissue]
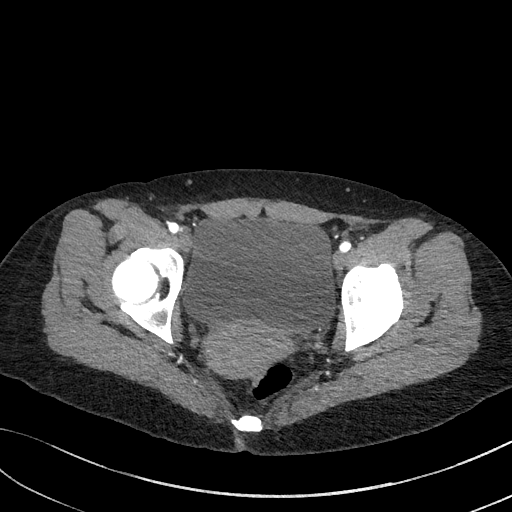
[im 89/319  soft-tissue]
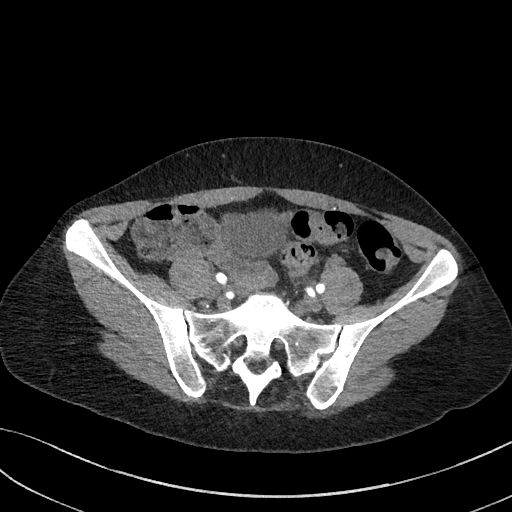
[im 107/319  soft-tissue]
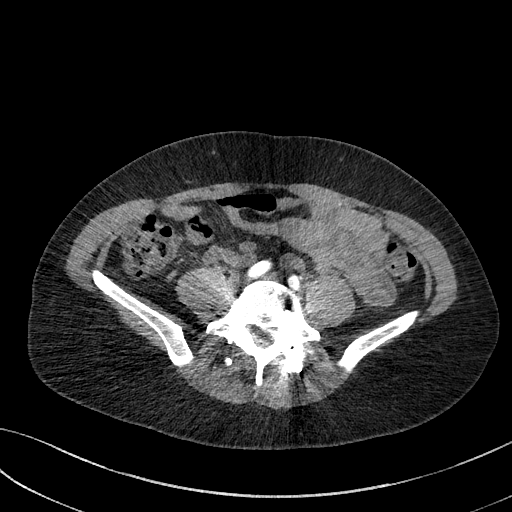
[im 142/319  soft-tissue]
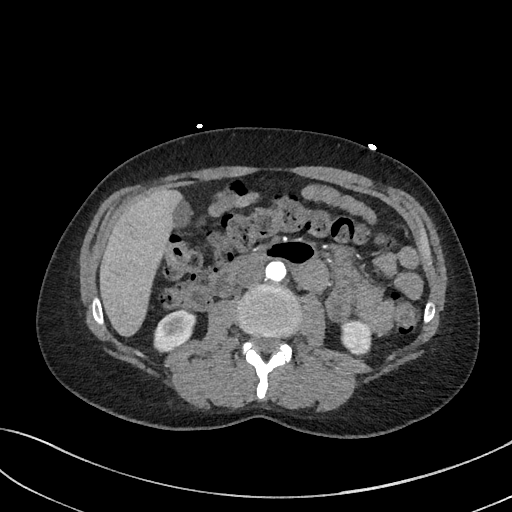
[im 177/319  soft-tissue]
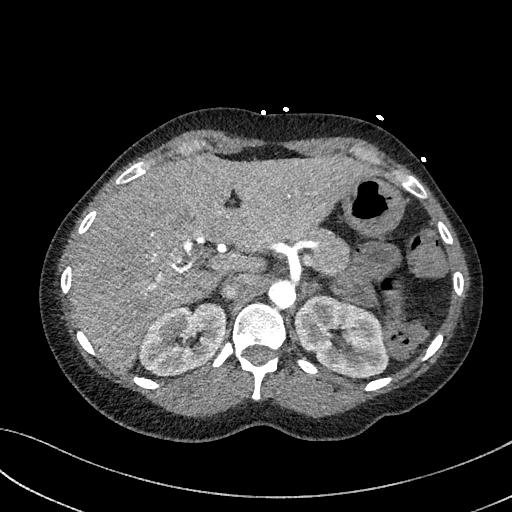
[im 213/319  soft-tissue]
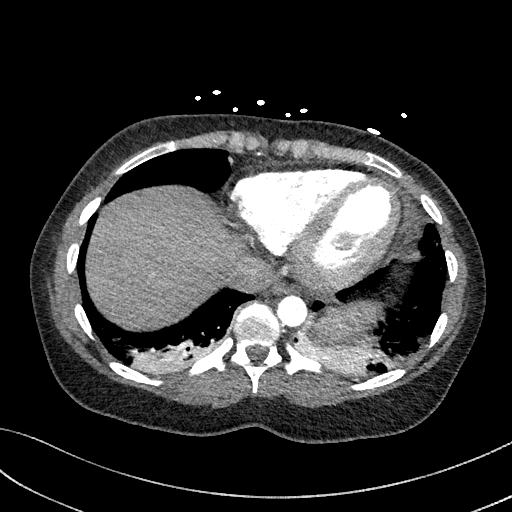
[im 230/319  soft-tissue]
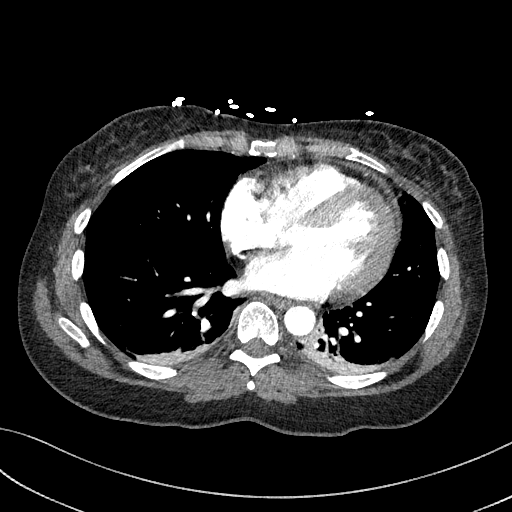
[im 266/319  soft-tissue]
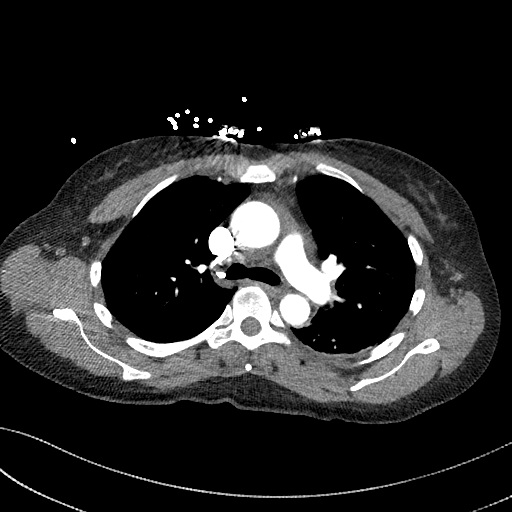
[im 266/319  bone]
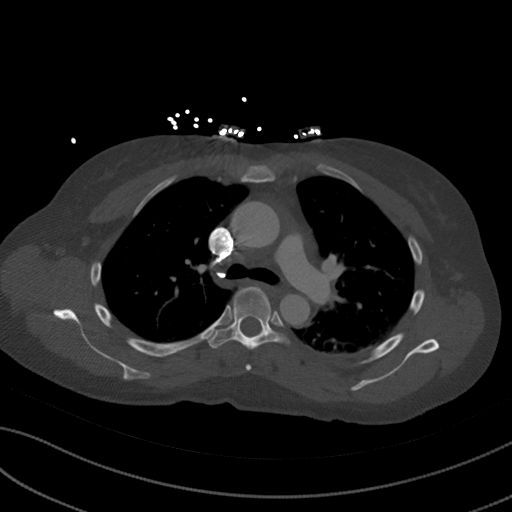
[im 301/319  soft-tissue]
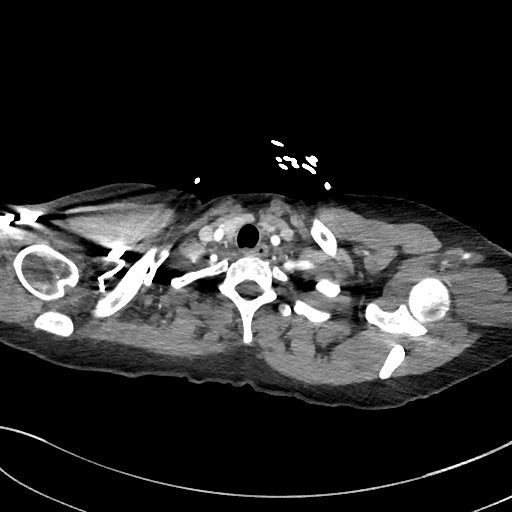

[Series 10: dissection 2mm cor · coronal · 0.68mm/px · 3 of 151 slices shown]
[im 38/151  soft-tissue]
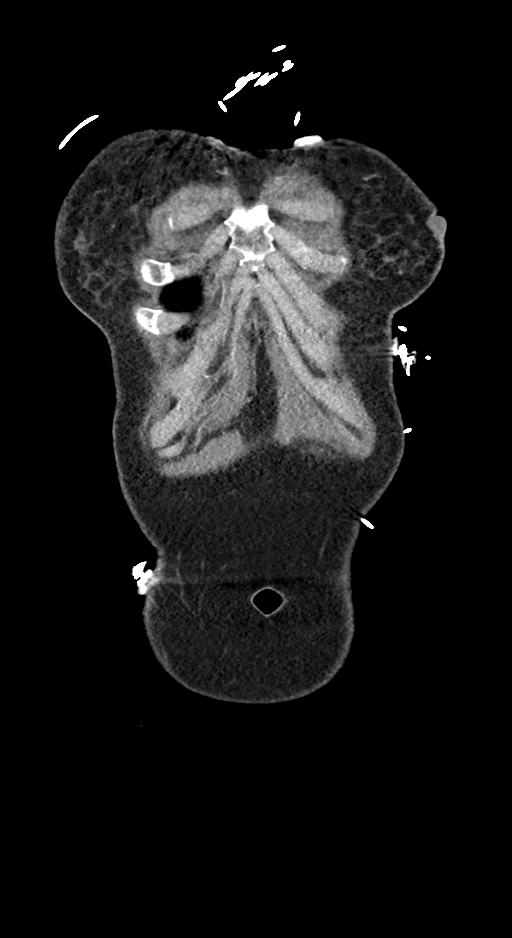
[im 76/151  soft-tissue]
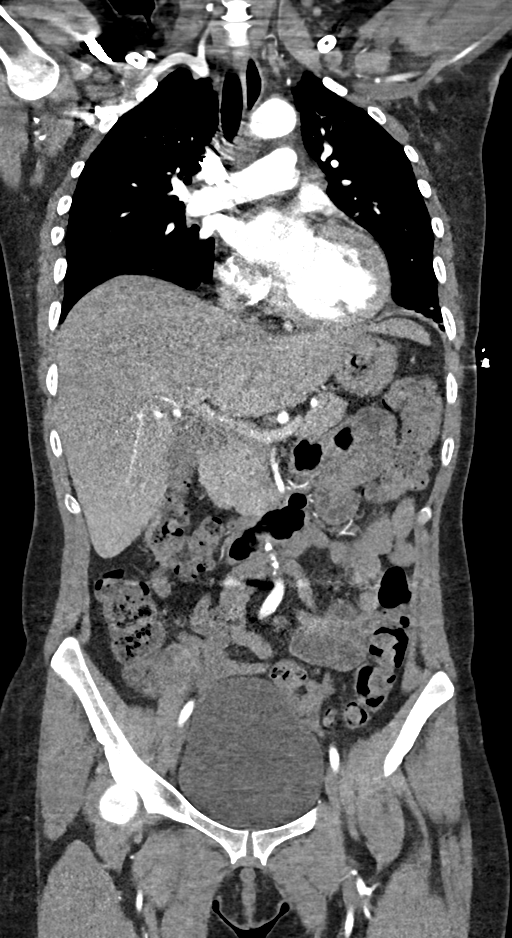
[im 113/151  soft-tissue]
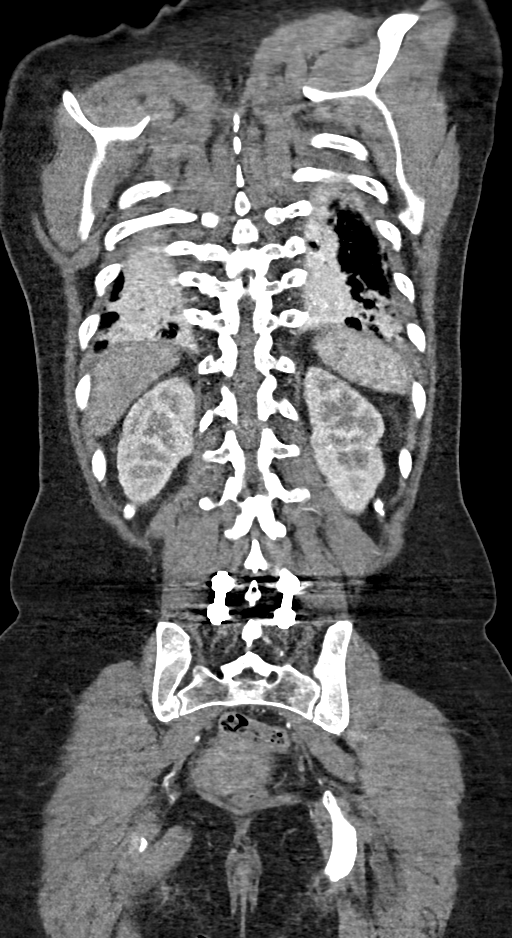

[13 of 46 positions shown; findings below may reference images not displayed]

FINDINGS: CTA CHEST FINDINGS

Cardiovascular: Initial precontrast images show no hyperattenuating
crescent within the aorta following contrast administration the
aorta is well visualized. The ascending aorta measures 3.5 cm. No
dissection is identified. Normal tapering is noted distally. Normal
branching pattern is seen. Pulmonary artery shows a normal branching
pattern without intraluminal filling defect to suggest pulmonary
embolism. Heart is at the upper limits of normal in size.

Mediastinum/Nodes: Thoracic inlet is within normal limits. No hilar
or mediastinal adenopathy is identified. The esophagus is within
normal limits.

Lungs/Pleura: Lungs are well aerated bilaterally with the exception
of mild bilateral lower lobe atelectasis/infiltrate. No sizable
effusion is noted.

Musculoskeletal: No acute abnormality noted.

Review of the MIP images confirms the above findings.

CTA ABDOMEN AND PELVIS FINDINGS

VASCULAR

Aorta: Mild atherosclerotic changes are noted without aneurysmal
dilatation. No focal area of narrowing is seen.

Celiac: Patent without evidence of aneurysm, dissection, vasculitis
or significant stenosis.

SMA: Patent without evidence of aneurysm, dissection, vasculitis or
significant stenosis.

Renals: Both renal arteries are patent without evidence of aneurysm,
dissection, vasculitis, fibromuscular dysplasia or significant
stenosis.

IMA: Patent without evidence of aneurysm, dissection, vasculitis or
significant stenosis.

Iliacs: Patent without evidence of aneurysm, dissection, vasculitis
or significant stenosis.

Veins: No vein abnormality is noted.

Review of the MIP images confirms the above findings.

NON-VASCULAR

Hepatobiliary: Hypodensity is noted in the lateral aspect of the
right lobe of the liver consistent with a small cyst. The
gallbladder is within normal limits.

Pancreas: Unremarkable. No pancreatic ductal dilatation or
surrounding inflammatory changes.

Spleen: Normal in size without focal abnormality.

Adrenals/Urinary Tract: Adrenal glands are unremarkable. Kidneys are
normal, without renal calculi, focal lesion, or hydronephrosis.
Bladder is unremarkable.

Stomach/Bowel: Stomach is within normal limits. Appendix appears
normal. No evidence of bowel wall thickening, distention, or
inflammatory changes.

Lymphatic: No significant vascular findings are present. No enlarged
abdominal or pelvic lymph nodes.

Reproductive: Uterus and bilateral adnexa are unremarkable.

Other: No abdominal wall hernia or abnormality. No abdominopelvic
ascites.

Musculoskeletal: Postsurgical changes are noted at L4-5

Review of the MIP images confirms the above findings.
IMPRESSION: No evidence of aortic abnormality or pulmonary embolism.

Bilateral lower lobe atelectasis/infiltrate

## 2019-06-18 ENCOUNTER — Other Ambulatory Visit: Payer: Self-pay | Admitting: Family Medicine

## 2019-06-18 MED ORDER — RAMIPRIL 10 MG PO CAPS: ORAL_CAPSULE | ORAL | 0 refills | Status: DC

## 2019-06-18 MED ORDER — HYDROCHLOROTHIAZIDE 25 MG PO TABS: 25.0000 mg | ORAL_TABLET | Freq: Every day | ORAL | 0 refills | Status: DC

## 2019-06-18 NOTE — Telephone Encounter (Signed)
Courtesy refill, pt has appt 07/12/2019

## 2019-06-18 NOTE — Telephone Encounter (Signed)
Copied from [place] [id number]. Topic: Quick Communication - Rx Refill/Question >> Jun 18, 2019  2:26 PM Leward Quan A wrote: Medication: hydrochlorothiazide (HYDRODIURIL) 25 MG tablet, ramipril (ALTACE) 10 MG capsule,   Has the patient contacted their pharmacy? Yes.   (Agent: If no, request that the patient contact the pharmacy for the refill.) (Agent: If yes, when and what did the pharmacy advise?)  Preferred Pharmacy (with phone number or street name): [facility], [place]. [phone] (Phone) [phone] (Fax)    Agent: Please be advised that RX refills may take up to 3 business days. We ask that you follow-up with your pharmacy.

## 2019-06-20 ENCOUNTER — Other Ambulatory Visit: Payer: Self-pay

## 2019-06-20 DIAGNOSIS — Z20822 Contact with and (suspected) exposure to covid-19: Secondary | ICD-10-CM

## 2019-06-22 LAB — NOVEL CORONAVIRUS, NAA: SARS-CoV-2, NAA: NOT DETECTED

## 2019-07-12 ENCOUNTER — Other Ambulatory Visit: Payer: Self-pay

## 2019-07-12 ENCOUNTER — Ambulatory Visit: Payer: BC Managed Care – PPO | Admitting: Family Medicine

## 2019-07-12 ENCOUNTER — Encounter: Payer: Self-pay | Admitting: Family Medicine

## 2019-07-12 VITALS — BP 115/72 | HR 94 | Temp 97.8°F | Wt 129.0 lb

## 2019-07-12 DIAGNOSIS — I10 Essential (primary) hypertension: Secondary | ICD-10-CM

## 2019-07-12 MED ORDER — RAMIPRIL 10 MG PO CAPS: ORAL_CAPSULE | ORAL | 3 refills | Status: DC

## 2019-07-12 MED ORDER — HYDROCHLOROTHIAZIDE 25 MG PO TABS: 25.0000 mg | ORAL_TABLET | Freq: Every day | ORAL | 3 refills | Status: DC

## 2019-07-12 NOTE — Progress Notes (Signed)
Subjective:    Patient ID: [name], female    DOB: 1976-05-12, 43 y.o.   MRN: [id number]  No chief complaint on file.   HPI Patient was seen today for f/u on HTN.  Pt doing well on HCTZ 25 mg and ramipril 10 mg daily.  Checking BP at home typically 120s over 80s.  Patient exercising regularly.  States feels good overall.  Patient denies headaches, dizziness, CP, changes in vision, or muscle cramps.  Since last visit, pt had hip replacement surgery.  States is feeling much better.  Was up walking with a walker the same day she had surgery.  Past Medical History:  Diagnosis Date  . Compression fracture of lumbar vertebra ([place])   . Hypertension     No Known Allergies  ROS General: Denies fever, chills, night sweats, changes in weight, changes in appetite HEENT: Denies headaches, ear pain, changes in vision, rhinorrhea, sore throat CV: Denies CP, palpitations, SOB, orthopnea Pulm: Denies SOB, cough, wheezing GI: Denies abdominal pain, nausea, vomiting, diarrhea, constipation GU: Denies dysuria, hematuria, frequency, vaginal discharge Msk: Denies muscle cramps, joint pains Neuro: Denies weakness, numbness, tingling Skin: Denies rashes, bruising Psych: Denies depression, anxiety, hallucinations    Objective:    Blood pressure 128/88, pulse 94, temperature 97.8 F (36.6 C), weight 129 lb (58.5 kg), SpO2 98 %.  Gen. Pleasant, well-nourished, in no distress, normal affect   HEENT: [place]/AT, face symmetric, no scleral icterus, PERRLA, EOMI, nares patent without drainage Lungs: no accessory muscle use, CTAB, no wheezes or rales Cardiovascular: RRR, no m/r/g, no peripheral edema Musculoskeletal: No deformities, no cyanosis or clubbing, normal tone Neuro:  A&Ox3, CN II-XII intact, normal gait Skin:  Warm, no lesions/ rash.  Area of ecchymosis on L forearm.  Wt Readings from Last 3 Encounters:  09/06/18 137 lb (62.1 kg)  01/10/18 135 lb (61.2 kg)  11/10/17 139 lb (63 kg)     Lab Results  Component Value Date   WBC 13.6 (H) 01/10/2018   HGB 12.8 01/10/2018   HCT 39.0 01/10/2018   PLT 265 01/10/2018   GLUCOSE 95 01/10/2018   CHOL 175 09/16/2016   TRIG 89.0 09/16/2016   HDL 64.30 09/16/2016   LDLCALC 93 09/16/2016   ALT 13 09/16/2016   AST 19 09/16/2016   NA 137 01/10/2018   K 3.6 01/10/2018   CL 103 01/10/2018   CREATININE 0.70 01/10/2018   BUN 15 01/10/2018   CO2 24 01/10/2018   TSH 0.77 09/16/2016    Assessment/Plan:  Essential hypertension  -controlled -repeat bp 115/72 -BMP suggested.  Pt declines at this time. -Continue lifestyle modifications -Continue checking BP at home - Plan: hydrochlorothiazide (HYDRODIURIL) 25 MG tablet, ramipril (ALTACE) 10 MG capsule  F/u prn in 6 months  Grier Mitts, MD

## 2019-08-31 DIAGNOSIS — M5416 Radiculopathy, lumbar region: Secondary | ICD-10-CM | POA: Diagnosis not present

## 2019-08-31 DIAGNOSIS — I700 Atherosclerosis of aorta: Secondary | ICD-10-CM | POA: Diagnosis not present

## 2019-08-31 DIAGNOSIS — M545 Low back pain: Secondary | ICD-10-CM | POA: Diagnosis not present

## 2019-08-31 DIAGNOSIS — I10 Essential (primary) hypertension: Secondary | ICD-10-CM | POA: Diagnosis not present

## 2019-09-01 DIAGNOSIS — M5416 Radiculopathy, lumbar region: Secondary | ICD-10-CM | POA: Diagnosis not present

## 2019-09-27 DIAGNOSIS — M5416 Radiculopathy, lumbar region: Secondary | ICD-10-CM | POA: Diagnosis not present

## 2019-09-27 DIAGNOSIS — M961 Postlaminectomy syndrome, not elsewhere classified: Secondary | ICD-10-CM | POA: Diagnosis not present

## 2019-10-25 DIAGNOSIS — M961 Postlaminectomy syndrome, not elsewhere classified: Secondary | ICD-10-CM | POA: Diagnosis not present

## 2019-10-30 DIAGNOSIS — M533 Sacrococcygeal disorders, not elsewhere classified: Secondary | ICD-10-CM | POA: Diagnosis not present

## 2019-10-30 DIAGNOSIS — Z981 Arthrodesis status: Secondary | ICD-10-CM | POA: Diagnosis not present

## 2019-10-30 DIAGNOSIS — M961 Postlaminectomy syndrome, not elsewhere classified: Secondary | ICD-10-CM | POA: Diagnosis not present

## 2019-10-30 DIAGNOSIS — M545 Low back pain: Secondary | ICD-10-CM | POA: Diagnosis not present

## 2019-11-21 DIAGNOSIS — M533 Sacrococcygeal disorders, not elsewhere classified: Secondary | ICD-10-CM | POA: Diagnosis not present

## 2019-12-07 DIAGNOSIS — M533 Sacrococcygeal disorders, not elsewhere classified: Secondary | ICD-10-CM | POA: Diagnosis not present

## 2019-12-07 DIAGNOSIS — Z981 Arthrodesis status: Secondary | ICD-10-CM | POA: Diagnosis not present

## 2020-01-01 DIAGNOSIS — M533 Sacrococcygeal disorders, not elsewhere classified: Secondary | ICD-10-CM | POA: Diagnosis not present

## 2020-01-10 DIAGNOSIS — M533 Sacrococcygeal disorders, not elsewhere classified: Secondary | ICD-10-CM | POA: Diagnosis not present

## 2020-01-10 DIAGNOSIS — M961 Postlaminectomy syndrome, not elsewhere classified: Secondary | ICD-10-CM | POA: Diagnosis not present

## 2020-01-28 DIAGNOSIS — M545 Low back pain: Secondary | ICD-10-CM | POA: Diagnosis not present

## 2020-01-28 DIAGNOSIS — M25551 Pain in right hip: Secondary | ICD-10-CM | POA: Diagnosis not present

## 2020-02-11 DIAGNOSIS — Z309 Encounter for contraceptive management, unspecified: Secondary | ICD-10-CM | POA: Diagnosis not present

## 2020-02-11 DIAGNOSIS — Z01419 Encounter for gynecological examination (general) (routine) without abnormal findings: Secondary | ICD-10-CM | POA: Diagnosis not present

## 2020-02-11 DIAGNOSIS — Z6823 Body mass index (BMI) 23.0-23.9, adult: Secondary | ICD-10-CM | POA: Diagnosis not present

## 2020-02-11 DIAGNOSIS — Z1231 Encounter for screening mammogram for malignant neoplasm of breast: Secondary | ICD-10-CM | POA: Diagnosis not present

## 2020-02-11 DIAGNOSIS — R319 Hematuria, unspecified: Secondary | ICD-10-CM | POA: Diagnosis not present

## 2020-03-10 DIAGNOSIS — M47816 Spondylosis without myelopathy or radiculopathy, lumbar region: Secondary | ICD-10-CM | POA: Diagnosis not present

## 2020-03-14 DIAGNOSIS — M25551 Pain in right hip: Secondary | ICD-10-CM | POA: Diagnosis not present

## 2020-04-03 DIAGNOSIS — M47816 Spondylosis without myelopathy or radiculopathy, lumbar region: Secondary | ICD-10-CM | POA: Diagnosis not present

## 2020-04-18 DIAGNOSIS — M533 Sacrococcygeal disorders, not elsewhere classified: Secondary | ICD-10-CM | POA: Diagnosis not present

## 2020-04-18 DIAGNOSIS — Z981 Arthrodesis status: Secondary | ICD-10-CM | POA: Diagnosis not present

## 2020-04-21 ENCOUNTER — Other Ambulatory Visit: Payer: Self-pay | Admitting: Orthopedic Surgery

## 2020-04-21 DIAGNOSIS — M533 Sacrococcygeal disorders, not elsewhere classified: Secondary | ICD-10-CM

## 2020-04-21 DIAGNOSIS — G8929 Other chronic pain: Secondary | ICD-10-CM

## 2020-05-06 ENCOUNTER — Other Ambulatory Visit: Payer: Self-pay

## 2020-05-06 ENCOUNTER — Ambulatory Visit
Admission: RE | Admit: 2020-05-06 | Discharge: 2020-05-06 | Disposition: A | Discharge status: Home / Self Care | Payer: BC Managed Care – PPO | Source: Ambulatory Visit | Attending: Orthopedic Surgery | Admitting: Orthopedic Surgery

## 2020-05-06 DIAGNOSIS — G8929 Other chronic pain: Secondary | ICD-10-CM

## 2020-05-06 DIAGNOSIS — M533 Sacrococcygeal disorders, not elsewhere classified: Secondary | ICD-10-CM

## 2020-05-06 DIAGNOSIS — R52 Pain, unspecified: Secondary | ICD-10-CM | POA: Diagnosis not present

## 2020-05-13 DIAGNOSIS — M533 Sacrococcygeal disorders, not elsewhere classified: Secondary | ICD-10-CM | POA: Diagnosis not present

## 2020-05-13 DIAGNOSIS — M259 Joint disorder, unspecified: Secondary | ICD-10-CM | POA: Diagnosis not present

## 2020-05-13 DIAGNOSIS — M545 Low back pain, unspecified: Secondary | ICD-10-CM | POA: Diagnosis not present

## 2020-05-16 ENCOUNTER — Other Ambulatory Visit: Payer: Self-pay | Admitting: Orthopedic Surgery

## 2020-05-16 DIAGNOSIS — M259 Joint disorder, unspecified: Secondary | ICD-10-CM

## 2020-05-21 ENCOUNTER — Other Ambulatory Visit: Payer: BC Managed Care – PPO

## 2020-06-04 ENCOUNTER — Other Ambulatory Visit: Payer: Self-pay

## 2020-06-04 ENCOUNTER — Ambulatory Visit
Admission: RE | Admit: 2020-06-04 | Discharge: 2020-06-04 | Disposition: A | Discharge status: Home / Self Care | Payer: BC Managed Care – PPO | Source: Ambulatory Visit | Attending: Orthopedic Surgery | Admitting: Orthopedic Surgery

## 2020-06-04 DIAGNOSIS — M533 Sacrococcygeal disorders, not elsewhere classified: Secondary | ICD-10-CM | POA: Diagnosis not present

## 2020-06-04 DIAGNOSIS — M259 Joint disorder, unspecified: Secondary | ICD-10-CM

## 2020-06-11 DIAGNOSIS — M5416 Radiculopathy, lumbar region: Secondary | ICD-10-CM | POA: Diagnosis not present

## 2020-07-03 DIAGNOSIS — Z981 Arthrodesis status: Secondary | ICD-10-CM | POA: Diagnosis not present

## 2020-07-03 DIAGNOSIS — Z79899 Other long term (current) drug therapy: Secondary | ICD-10-CM | POA: Diagnosis not present

## 2020-07-03 DIAGNOSIS — M961 Postlaminectomy syndrome, not elsewhere classified: Secondary | ICD-10-CM | POA: Diagnosis not present

## 2020-07-18 ENCOUNTER — Telehealth: Payer: Self-pay | Admitting: Family Medicine

## 2020-07-18 ENCOUNTER — Other Ambulatory Visit: Payer: Self-pay

## 2020-07-18 DIAGNOSIS — I10 Essential (primary) hypertension: Secondary | ICD-10-CM

## 2020-07-18 MED ORDER — RAMIPRIL 10 MG PO CAPS: ORAL_CAPSULE | ORAL | 0 refills | Status: DC

## 2020-07-18 MED ORDER — HYDROCHLOROTHIAZIDE 25 MG PO TABS: 25.0000 mg | ORAL_TABLET | Freq: Every day | ORAL | 0 refills | Status: DC

## 2020-07-18 NOTE — Telephone Encounter (Signed)
Pt is calling in needing a refill on hydrochlorothiazide (HYDRDIURIL) 25 MG and ramipril (ALTRACE) 10 MG only have 2 pills.  Pharm:  [facility]  Pt has made an appointment on 08/23/2020 @ 10:30.

## 2020-07-18 NOTE — Telephone Encounter (Signed)
Pt Rx sent to pharmacy for 30 days refill, pt has a f/u appointment on 07/23/2020

## 2020-07-23 ENCOUNTER — Encounter: Payer: Self-pay | Admitting: Family Medicine

## 2020-07-23 ENCOUNTER — Other Ambulatory Visit: Payer: Self-pay

## 2020-07-23 ENCOUNTER — Ambulatory Visit: Payer: BC Managed Care – PPO | Admitting: Family Medicine

## 2020-07-23 VITALS — BP 136/84 | HR 98 | Temp 98.7°F | Wt 132.8 lb

## 2020-07-23 DIAGNOSIS — M545 Low back pain, unspecified: Secondary | ICD-10-CM | POA: Diagnosis not present

## 2020-07-23 DIAGNOSIS — G8929 Other chronic pain: Secondary | ICD-10-CM

## 2020-07-23 DIAGNOSIS — I10 Essential (primary) hypertension: Secondary | ICD-10-CM | POA: Diagnosis not present

## 2020-07-23 LAB — BASIC METABOLIC PANEL
BUN: 11 mg/dL (ref 6–23)
CO2: 29 mEq/L (ref 19–32)
Calcium: 10 mg/dL (ref 8.4–10.5)
Chloride: 95 mEq/L — ABNORMAL LOW (ref 96–112)
Creatinine, Ser: 0.76 mg/dL (ref 0.40–1.20)
GFR: 96.38 mL/min (ref >60.00)
Glucose, Bld: 88 mg/dL (ref 70–99)
Potassium: 3.6 mEq/L (ref 3.5–5.1)
Sodium: 133 mEq/L — ABNORMAL LOW (ref 135–145)

## 2020-07-23 MED ORDER — HYDROCHLOROTHIAZIDE 25 MG PO TABS: 25.0000 mg | ORAL_TABLET | Freq: Every day | ORAL | 3 refills | Status: DC

## 2020-07-23 MED ORDER — RAMIPRIL 10 MG PO CAPS: ORAL_CAPSULE | ORAL | 3 refills | Status: DC

## 2020-07-23 NOTE — Progress Notes (Signed)
Subjective:    Patient ID: [name], female    DOB: 06/17/1976, 45 y.o.   MRN: [id number]  No chief complaint on file.   HPI Patient is a 45 yo female with pmh sig for HTN, h/o lumbar compression fx, s/p L THR who was seen today for f/u.  Pt endorses continued back pain.  Seen by [facility], being referred to [facility] for possible fusion.  Pt endorses having a previous fusion around L4.  Unable to get comfortable sitting 2/2 pain.  Taking Aleeve, hydrocodone, using ice.  Lidocaine patches, injections have not helped.  Pt notes the discomfort has caused her bp to become elevated, 130s systolic at home.  Taking ramipril 10 mg and HCTZ 25 mg.    Past Medical History:  Diagnosis Date  . Compression fracture of lumbar vertebra (HCC)   . Hypertension     No Known Allergies  ROS General: Denies fever, chills, night sweats, changes in weight, changes in appetite HEENT: Denies headaches, ear pain, changes in vision, rhinorrhea, sore throat CV: Denies CP, palpitations, SOB, orthopnea Pulm: Denies SOB, cough, wheezing GI: Denies abdominal pain, nausea, vomiting, diarrhea, constipation GU: Denies dysuria, hematuria, frequency, vaginal discharge Msk: Denies muscle cramps, joint pains  +back pain Neuro: Denies weakness, numbness, tingling Skin: Denies rashes, bruising Psych: Denies depression, anxiety, hallucinations     Objective:    Blood pressure 136/84, pulse 98, temperature 98.7 F (37.1 C), temperature source Oral, weight 132 lb 12.8 oz (60.2 kg), SpO2 98 %.  Gen. Pleasant, well-nourished, in no distress, normal affect   HEENT: [place]/AT, face symmetric, conjunctiva clear, no scleral icterus, PERRLA, EOMI, nares patent without drainage Lungs: no accessory muscle use, CTAB, no wheezes or rales Cardiovascular: RRR, no m/r/g, no peripheral edema Musculoskeletal: No deformities, no cyanosis or clubbing, normal tone Neuro:  A&Ox3, CN II-XII intact, normal gait Skin:  Warm, no  lesions/ rash   Wt Readings from Last 3 Encounters:  07/23/20 132 lb 12.8 oz (60.2 kg)  07/12/19 129 lb (58.5 kg)  09/06/18 137 lb (62.1 kg)    Lab Results  Component Value Date   WBC 13.6 (H) 01/10/2018   HGB 12.8 01/10/2018   HCT 39.0 01/10/2018   PLT 265 01/10/2018   GLUCOSE 95 01/10/2018   CHOL 175 09/16/2016   TRIG 89.0 09/16/2016   HDL 64.30 09/16/2016   LDLCALC 93 09/16/2016   ALT 13 09/16/2016   AST 19 09/16/2016   NA 137 01/10/2018   K 3.6 01/10/2018   CL 103 01/10/2018   CREATININE 0.70 01/10/2018   BUN 15 01/10/2018   CO2 24 01/10/2018   TSH 0.77 09/16/2016    Assessment/Plan:  Essential hypertension -elevated likely 2/2 back pain -continue current meds HCTZ 25 mg and ramipril 10 mg -continue lifestyle modifications -consider dose adjustments for continued elevation -will obtain bmp this visit  - Plan: BASIC METABOLIC PANEL WITH GFR, hydrochlorothiazide (HYDRODIURIL) 25 MG tablet, ramipril (ALTACE) 10 MG capsule  Chronic midline low back pain, sciatica unspecified -continue Aleeve, hydrocodone prn and other supportive care. -continue f/u with Ortho and Neurosurgery.  F/u in 4-6 months  Abbe Amsterdam, MD

## 2020-07-24 ENCOUNTER — Other Ambulatory Visit: Payer: Self-pay | Admitting: Family Medicine

## 2020-07-24 DIAGNOSIS — E871 Hypo-osmolality and hyponatremia: Secondary | ICD-10-CM

## 2020-07-29 NOTE — Addendum Note (Signed)
Addended by: Lerry Liner on: 07/29/2020 12:52 PM   Modules accepted: Orders

## 2020-08-01 ENCOUNTER — Other Ambulatory Visit: Payer: BC Managed Care – PPO

## 2020-08-05 DIAGNOSIS — M48062 Spinal stenosis, lumbar region with neurogenic claudication: Secondary | ICD-10-CM | POA: Diagnosis not present

## 2020-08-20 DIAGNOSIS — M545 Low back pain, unspecified: Secondary | ICD-10-CM | POA: Diagnosis not present

## 2020-08-20 DIAGNOSIS — Z981 Arthrodesis status: Secondary | ICD-10-CM | POA: Diagnosis not present

## 2020-08-20 DIAGNOSIS — M48062 Spinal stenosis, lumbar region with neurogenic claudication: Secondary | ICD-10-CM | POA: Diagnosis not present

## 2020-09-13 DIAGNOSIS — M48061 Spinal stenosis, lumbar region without neurogenic claudication: Secondary | ICD-10-CM | POA: Diagnosis not present

## 2020-09-13 DIAGNOSIS — Z9889 Other specified postprocedural states: Secondary | ICD-10-CM | POA: Diagnosis not present

## 2020-10-23 DIAGNOSIS — M25551 Pain in right hip: Secondary | ICD-10-CM | POA: Diagnosis not present

## 2020-11-18 ENCOUNTER — Telehealth: Payer: Self-pay | Admitting: Family Medicine

## 2020-11-18 NOTE — Telephone Encounter (Addendum)
Patient is calling is calling and stated that she is experiencing diarrhea for 7 days with fatigue, nausea and stomach cramps and wanted to come in the office. Offered a virtual but patient  declined visit and stated that her phone is not set up for virtual appointments, please advise. CB is [phone]

## 2020-11-19 ENCOUNTER — Encounter: Payer: Self-pay | Admitting: Adult Health

## 2020-11-19 ENCOUNTER — Telehealth (INDEPENDENT_AMBULATORY_CARE_PROVIDER_SITE_OTHER): Payer: BC Managed Care – PPO | Admitting: Adult Health

## 2020-11-19 DIAGNOSIS — K529 Noninfective gastroenteritis and colitis, unspecified: Secondary | ICD-10-CM | POA: Diagnosis not present

## 2020-11-19 MED ORDER — ONDANSETRON HCL 4 MG PO TABS: 4.0000 mg | ORAL_TABLET | Freq: Three times a day (TID) | ORAL | 0 refills | Status: DC | PRN

## 2020-11-19 MED ORDER — DICYCLOMINE HCL 10 MG PO CAPS: 10.0000 mg | ORAL_CAPSULE | Freq: Three times a day (TID) | ORAL | 0 refills | Status: DC

## 2020-11-19 NOTE — Telephone Encounter (Signed)
Pt called back and has been scheduled for today 11/19/2020

## 2020-11-19 NOTE — Progress Notes (Signed)
Virtual Visit via Telephone Note  I connected with [name] on 11/19/20 at  3:00 PM EDT by telephone and verified that I am speaking with the correct person using two identifiers.   I discussed the limitations, risks, security and privacy concerns of performing an evaluation and management service by telephone and the availability of in person appointments. I also discussed with the patient that there may be a patient responsible charge related to this service. The patient expressed understanding and agreed to proceed.  Location patient: home Location provider: work or home office Participants present for the call: patient, provider Patient did not have a visit in the prior 7 days to address this/these issue(s).   History of Present Illness: 45 year old female who  has a past medical history of Compression fracture of lumbar vertebra (HCC) and Hypertension.   She has been evaluated today for an acute issue.  Symptoms started 1 week ago.  Symptoms include diarrhea.  She reports that she is having 10-20 episodes of diarrhea per day.  Associated symptoms include generalized abdominal cramping and nausea.  She denies fevers or chills.  Has not been on any antibiotics the hospital admission recently.  Nobody else in her home is sick.  She has not eaten any questionable food.  He does not have any history of irritable bowel, C. difficile, or ulcerative colitis.  She has been staying hydrated with water and Gatorade as well as using Pepto-Bismol but has not noticed any improvement when using Pepto.  Has not noticed any blood in stool.   Observations/Objective: Patient sounds cheerful and well on the phone. I do not appreciate any SOB. Speech and thought processing are grossly intact. Patient reported vitals:  Assessment and Plan: 1. Gastroenteritis Doubtful for  C. difficile, diverticulitis, or UC.  Will prescribe Bentyl and Zofran.  She will follow-up in the next couple of days if  not improving and at that time we can do a stool culture. - dicyclomine (BENTYL) 10 MG capsule; Take 1 capsule (10 mg total) by mouth 3 (three) times daily before meals for 10 days.  Dispense: 30 capsule; Refill: 0 - ondansetron (ZOFRAN) 4 MG tablet; Take 1 tablet (4 mg total) by mouth every 8 (eight) hours as needed for nausea or vomiting.  Dispense: 20 tablet; Refill: 0    Follow Up Instructions:  I did not refer this patient for an OV in the next 24 hours for this/these issue(s).  I discussed the assessment and treatment plan with the patient. The patient was provided an opportunity to ask questions and all were answered. The patient agreed with the plan and demonstrated an understanding of the instructions.   The patient was advised to call back or seek an in-person evaluation if the symptoms worsen or if the condition fails to improve as anticipated.  I provided 17 minutes of non-face-to-face time during this encounter.   [name], NP

## 2020-11-26 DIAGNOSIS — M25551 Pain in right hip: Secondary | ICD-10-CM | POA: Diagnosis not present

## 2020-12-15 DIAGNOSIS — I10 Essential (primary) hypertension: Secondary | ICD-10-CM | POA: Diagnosis not present

## 2020-12-15 DIAGNOSIS — R21 Rash and other nonspecific skin eruption: Secondary | ICD-10-CM | POA: Diagnosis not present

## 2021-04-13 DIAGNOSIS — Z01419 Encounter for gynecological examination (general) (routine) without abnormal findings: Secondary | ICD-10-CM | POA: Diagnosis not present

## 2021-04-13 DIAGNOSIS — Z6823 Body mass index (BMI) 23.0-23.9, adult: Secondary | ICD-10-CM | POA: Diagnosis not present

## 2021-04-13 DIAGNOSIS — Z1231 Encounter for screening mammogram for malignant neoplasm of breast: Secondary | ICD-10-CM | POA: Diagnosis not present

## 2021-04-16 ENCOUNTER — Encounter: Payer: Self-pay | Admitting: Family Medicine

## 2021-04-16 DIAGNOSIS — Z5181 Encounter for therapeutic drug level monitoring: Secondary | ICD-10-CM | POA: Diagnosis not present

## 2021-04-16 DIAGNOSIS — Z79899 Other long term (current) drug therapy: Secondary | ICD-10-CM | POA: Diagnosis not present

## 2021-04-23 DIAGNOSIS — M1611 Unilateral primary osteoarthritis, right hip: Secondary | ICD-10-CM | POA: Diagnosis not present

## 2021-06-09 ENCOUNTER — Telehealth: Payer: Self-pay | Admitting: Family Medicine

## 2021-06-09 DIAGNOSIS — Z0189 Encounter for other specified special examinations: Secondary | ICD-10-CM | POA: Diagnosis not present

## 2021-06-09 NOTE — Telephone Encounter (Signed)
Patient brought in paperwork that she needs Dr.Banks to complete. Paperwork would be placed in folder.  Paperwork could be faxed to [phone] ATTN to Aida Raider when completed.  Please advise.

## 2021-06-11 NOTE — Telephone Encounter (Signed)
Spoke with pt, visit scheduled 06/17/21.

## 2021-06-17 ENCOUNTER — Ambulatory Visit: Payer: BC Managed Care – PPO | Admitting: Family Medicine

## 2021-06-17 ENCOUNTER — Encounter: Payer: Self-pay | Admitting: Family Medicine

## 2021-06-17 VITALS — BP 124/80 | HR 107 | Temp 99.3°F | Wt 130.6 lb

## 2021-06-17 DIAGNOSIS — Z01818 Encounter for other preprocedural examination: Secondary | ICD-10-CM | POA: Diagnosis not present

## 2021-06-17 NOTE — Progress Notes (Signed)
Chief Complaint  Patient presents with   Pre-op Exam    HPI:  Patient is seen for optimization of general medical care prior to surgery. Surgery type: R THR Date of surgery: 06/23/2021  H/o L THR and spinal fusion surgery.  Pt endorses having labs done last wk near the hospital.  No longer smoking.  Using juul vape maybe twice a day.  Kidney disease? No  Prior surgeries/Issues following anesthesia? no Hx MI, heart arrythmia, CHF, angina or stroke? None  Epilepsy or Seizures? None no Arthritis or problems with neck or jaw? None  no Thyroid disease? None  Liver disease? None  Asthma, COPD or chronic lung disease? None  Diabetes? None  (Needs to be evaluated by anesthesia if yes to these questions.)  Other: Poor nutrition, Frail or other: no  METS:  ?Can take care of self, such as eat, dress, or use the toilet (1 MET). yes ?Can walk up a flight of steps or a hill (4 METs).yes ?Can do heavy work around the house such as scrubbing floors or lifting or moving heavy furniture (between 4 and 10 METs). yes ?Can participate in strenuous sports such as swimming, singles tennis, football, basketball, and skiing (>10 METs) . AHA Risks: Major predictors that require intensive management and may lead to delay in or cancellation of the operative procedure unless emergent: NONE   Unstable coronary syndromes including unstable or severe angina or recent MI   Decompensated heart failure including NYHA functional class IV or worsening or new-onset HF   Significant arrhythmias including high grade AV block, symptomatic ventricular arrhythmias, supraventricular arrhythmias with ventricular rate >100 bpm at rest, symptomatic bradycardia, and newly recognized ventricular tachycardia   Severe heart valve disease including severe aortic stenosis or symptomatic mitral stenosis   Other clinical predictors that warrant careful assessment of current status: NONE   History of ischemic heart disease   History of cerebrovascular disease   History of compensated heart failure or prior heart failure   Diabetes mellitus   Renal insufficiency  Type of surgery and Risk: 1) High risk (reported risk of cardiac death or nonfatal myocardial infarction [MI] often greater than 5 percent):   Aortic and other major vascular surgery   Peripheral artery surgery   2)Intermediate risk (reported risk of cardiac death or nonfatal MI generally 1 to 5 percent):   Carotid endarterectomy   Head and neck surgery   Intraperitoneal and intrathoracic surgery   Orthopedic surgery   Prostate surgery   3)Low risk (reported risk of cardiac death or nonfatal MI generally less than 1 percent):   Ambulatory surgery   Endoscopic procedures   Superficial procedure   Cataract surgery   Breast surgery  Medications that need to be addressed prior to surgery: None Discontinue acei/arbs/non-statin lipid lowering drugs day of surgery ASA stop 7 days before or discuss with cardiology if CV risks, other anticoagulants discuss with cardiology.  ROS: See pertinent positives and negatives per HPI. 11 point ROS negative except where noted.  Past Medical History:  Diagnosis Date   Compression fracture of lumbar vertebra ([place])    Hypertension     Past Surgical History:  Procedure Laterality Date   BACK SURGERY     BACK SURGERY  07/2012   lumbar spinal fusion L4 - Elsner, MD    No family history on file.  Social History   Socioeconomic History   Marital status: Married    Spouse name: Not on file   Number of children: Not  on file   Years of education: Not on file   Highest education level: Not on file  Occupational History   Not on file  Tobacco Use   Smoking status: Every Day    Packs/day: 0.25    Years: 15.00    Pack years: 3.75    Types: Cigarettes   Smokeless tobacco: Never   Tobacco comments:    4-5 cigarettes a day  Substance and Sexual Activity   Alcohol use: No    Comment: rarely   Drug  use: No   Sexual activity: Yes  Other Topics Concern   Not on file  Social History Narrative   Not on file   Social Determinants of Health   Financial Resource Strain: Not on file  Food Insecurity: Not on file  Transportation Needs: Not on file  Physical Activity: Not on file  Stress: Not on file  Social Connections: Not on file     Current Outpatient Medications:    hydrochlorothiazide (HYDRODIURIL) 25 MG tablet, Take 1 tablet (25 mg total) by mouth daily., Disp: 90 tablet, Rfl: 3   HYDROcodone-acetaminophen (NORCO) 10-325 MG tablet, hydrocodone 10 mg-acetaminophen 325 mg tablet  TAKE ONE TABLET BY MOUTH FOUR TIMES DAILY AS NEEDED FOR PAIN, Disp: , Rfl:    ibuprofen (ADVIL,MOTRIN) 200 MG tablet, Take 800 mg by mouth every 6 (six) hours as needed (pain.)., Disp: , Rfl:    norethindrone-ethinyl estradiol-iron (JUNEL FE 1.5/30) 1.5-30 MG-MCG tablet, TAKE 1 TABLET EACH DAY., Disp: 84 tablet, Rfl: 1   ondansetron (ZOFRAN) 4 MG tablet, Take 1 tablet (4 mg total) by mouth every 8 (eight) hours as needed for nausea or vomiting., Disp: 20 tablet, Rfl: 0   ramipril (ALTACE) 10 MG capsule, TAKE (1) CAPSULE DAILY., Disp: 90 capsule, Rfl: 3   dicyclomine (BENTYL) 10 MG capsule, Take 1 capsule (10 mg total) by mouth 3 (three) times daily before meals for 10 days., Disp: 30 capsule, Rfl: 0  EXAM:  Vitals:   06/17/21 0857  BP: 124/80  Pulse: (!) 107  Temp: 99.3 F (37.4 C)  SpO2: 99%    Body mass index is 23.13 kg/m.  GENERAL: vitals reviewed and listed above, alert, oriented, appears well hydrated and in no acute distress  HEENT: atraumatic, conjunttiva clear, no obvious abnormalities on inspection of external nose and ears  NECK: no obvious masses on inspection, no carotid bruits  LUNGS: clear to auscultation bilaterally, no wheezes, rales or rhonchi, good air movement  CV: tachycardia, no peripheral edema, no JVD, BP normal range, normal radial pulses  MS: moves all extremities  without noticeable abnormality  PSYCH: pleasant and cooperative, no obvious depression or anxiety  ASSESSMENT AND PLAN:  Discussed the following assessment and plan:  Preop examination  -labs completed per pt -EKG with NSR, LVH by criteria.  Borderline RBBB.  Findings similar to previous x-ray from 01/10/2018. -encouraged to stop vaping. - Plan: EKG 12-Lead  Assessment: -Risk factors: none -Surgery Risks:intermediate -age, nutritional status, fraility: good nutritional status, age <65, no fraility -functional capacity: > 4 METs without symptoms -comorbidities: HTN Patient Specific Risks: patient is low risk for intermediate risks surgery   Recommendations for optimizing general medical care prior to surgery: -advised patient to discuss specific risks morbidity and mortality of surgery with surgeon, CV risks discussed with patient -advised patient will defer to surgeon for post-op DVT prophylaxis and post op care -no specific medical recommendations for this patient at this time and no recommendations to defer surgery or  for further CV testing prior to surgery -form for pre-op optimization of general medical care prior to surgery faxed to surgeon office  -Patient advised to return or notify a doctor immediately if symptoms worsen or persist or new concerns arise.   Billie Ruddy

## 2021-06-23 DIAGNOSIS — M1611 Unilateral primary osteoarthritis, right hip: Secondary | ICD-10-CM | POA: Diagnosis not present

## 2021-06-25 DIAGNOSIS — L03031 Cellulitis of right toe: Secondary | ICD-10-CM | POA: Diagnosis not present

## 2021-07-30 DIAGNOSIS — Z79899 Other long term (current) drug therapy: Secondary | ICD-10-CM | POA: Diagnosis not present

## 2021-07-30 DIAGNOSIS — M961 Postlaminectomy syndrome, not elsewhere classified: Secondary | ICD-10-CM | POA: Diagnosis not present

## 2021-08-11 ENCOUNTER — Telehealth: Payer: Self-pay | Admitting: Family Medicine

## 2021-08-11 DIAGNOSIS — I10 Essential (primary) hypertension: Secondary | ICD-10-CM

## 2021-08-11 MED ORDER — RAMIPRIL 10 MG PO CAPS: ORAL_CAPSULE | ORAL | 3 refills | Status: DC

## 2021-08-11 MED ORDER — HYDROCHLOROTHIAZIDE 25 MG PO TABS: 25.0000 mg | ORAL_TABLET | Freq: Every day | ORAL | 3 refills | Status: DC

## 2021-08-11 NOTE — Telephone Encounter (Signed)
Rxs sent

## 2021-08-11 NOTE — Telephone Encounter (Signed)
Patient is requesting a refill for ramipril (ALTACE) 10 MG capsule [id number] and  hydrochlorothiazide (HYDRODIURIL) 25 MG tablet [id number] to be sent to her pharmacy. Patient last office visit was on 06/17/2021.  Patient could be contacted at [phone].  Please advise.

## 2021-09-04 ENCOUNTER — Ambulatory Visit: Payer: BC Managed Care – PPO | Admitting: Family Medicine

## 2021-09-04 ENCOUNTER — Encounter: Payer: Self-pay | Admitting: Family Medicine

## 2021-09-04 VITALS — BP 167/123 | HR 119 | Temp 98.8°F | Wt 133.8 lb

## 2021-09-04 DIAGNOSIS — I10 Essential (primary) hypertension: Secondary | ICD-10-CM

## 2021-09-04 DIAGNOSIS — H66001 Acute suppurative otitis media without spontaneous rupture of ear drum, right ear: Secondary | ICD-10-CM

## 2021-09-04 MED ORDER — AMOXICILLIN-POT CLAVULANATE 500-125 MG PO TABS: 1.0000 | ORAL_TABLET | Freq: Two times a day (BID) | ORAL | 0 refills | Status: AC

## 2021-09-04 NOTE — Progress Notes (Signed)
Subjective:    Patient ID: [name], female    DOB: 08/18/1975, 46 y.o.   MRN: [id number]  Chief Complaint  Patient presents with   Ear Pain    Right ear started this morning and has gotten worse. Dull pain, hears like a fluid shift in it    HPI Patient was seen today for acute concern.  Patient endorses right ear feeling muffled/uncomfortable starting this morning.  Patient has since noticed decreased hearing.  States may have slight drainage but nothing major.  Denies headaches, fever, chills, rhinorrhea, sore throat, cough, fatigue.  Patient states BP has been controlled.  Endorses elevation due to running errands all morning.  Patient states she was seen by Ortho, had hip drained.  Has since been able to return to the gym x3 weeks.  Past Medical History:  Diagnosis Date   Compression fracture of lumbar vertebra ([facility])    Hypertension     No Known Allergies  ROS General: Denies fever, chills, night sweats, changes in weight, changes in appetite HEENT: Denies headaches, ear pain, changes in vision, rhinorrhea, sore throat  +R ear discomfort/decreased hearing CV: Denies CP, palpitations, SOB, orthopnea Pulm: Denies SOB, cough, wheezing GI: Denies abdominal pain, nausea, vomiting, diarrhea, constipation GU: Denies dysuria, hematuria, frequency, vaginal discharge Msk: Denies muscle cramps, joint pains Neuro: Denies weakness, numbness, tingling Skin: Denies rashes, bruising Psych: Denies depression, anxiety, hallucinations    Objective:    Blood pressure (!) 167/123, pulse (!) 119, temperature 98.8 F (37.1 C), temperature source Oral, weight 133 lb 12.8 oz (60.7 kg), SpO2 100 %.  Gen. Pleasant, well-nourished, in no distress, normal affect   HEENT: [place]/AT, face symmetric, conjunctiva clear, no scleral icterus, PERRLA, EOMI, nares patent without drainage, pharynx without erythema or exudate.  Left external ear and canal normal. L TM full. Right external ear and canal  normal.  Right TM full with mild erythema and suppurative fluid.  No cervical lymphadenopathy. Lungs: no accessory muscle use, CTAB, no wheezes or rales Cardiovascular: RRR, no m/r/g, no peripheral edema Neuro:  A&Ox3, CN II-XII intact, normal gait Skin:  Warm, no lesions/ rash   Wt Readings from Last 3 Encounters:  09/04/21 133 lb 12.8 oz (60.7 kg)  06/17/21 130 lb 9.6 oz (59.2 kg)  07/23/20 132 lb 12.8 oz (60.2 kg)   BP Readings from Last 3 Encounters:  09/04/21 (!) 167/123  06/17/21 124/80  07/23/20 136/84     Lab Results  Component Value Date   WBC 13.6 (H) 01/10/2018   HGB 12.8 01/10/2018   HCT 39.0 01/10/2018   PLT 265 01/10/2018   GLUCOSE 88 07/23/2020   CHOL 175 09/16/2016   TRIG 89.0 09/16/2016   HDL 64.30 09/16/2016   LDLCALC 93 09/16/2016   ALT 13 09/16/2016   AST 19 09/16/2016   NA 133 (L) 07/23/2020   K 3.6 07/23/2020   CL 95 (L) 07/23/2020   CREATININE 0.76 07/23/2020   BUN 11 07/23/2020   CO2 29 07/23/2020   TSH 0.77 09/16/2016    Assessment/Plan:  Acute suppurative otitis media of right ear without spontaneous rupture of tympanic membrane, recurrence not specified  -start abx -saline nasal rinse or flonase, Tylenol prn for discomfort. - Plan: amoxicillin-clavulanate (AUGMENTIN) 500-125 MG tablet  Essential hypertension -uncontrolled this visit.  typically controlled. BP Readings from Last 3 Encounters:  09/04/21 (!) 167/123  06/17/21 124/80  07/23/20 136/84  -elevation possibly 2/2 current AOM, rushing to appt/anxiety -recheck bp -continue HCTZ 25 mg and  ramipril 10 mg daily -monitor at home -f/u in 2-4 wks for recheck  F/u in 2-4 wks for bp recheck.  Prn for AOM.  Grier Mitts, MD

## 2021-09-18 ENCOUNTER — Ambulatory Visit (INDEPENDENT_AMBULATORY_CARE_PROVIDER_SITE_OTHER): Payer: BC Managed Care – PPO | Admitting: Family Medicine

## 2021-09-18 ENCOUNTER — Encounter: Payer: Self-pay | Admitting: Family Medicine

## 2021-09-18 VITALS — BP 162/103 | HR 105 | Temp 99.0°F | Wt 139.4 lb

## 2021-09-18 DIAGNOSIS — I10 Essential (primary) hypertension: Secondary | ICD-10-CM

## 2021-09-18 DIAGNOSIS — H6504 Acute serous otitis media, recurrent, right ear: Secondary | ICD-10-CM

## 2021-09-18 MED ORDER — CEFDINIR 300 MG PO CAPS: 300.0000 mg | ORAL_CAPSULE | Freq: Two times a day (BID) | ORAL | 0 refills | Status: AC

## 2021-09-18 NOTE — Progress Notes (Signed)
Subjective:  ? ? Patient ID: [name], female    DOB: [birth year], 46 y.o.   MRN: [id number] ? ?Chief Complaint  ?Patient presents with  ? Ear Fullness  ?  Still having issues with rt ear hear, can not hear. When bends or looks down, feels a shift. Finished antibiotics 8 days ago. Still issues with rt ear   ? ? ?HPI ?Patient was seen today for f/u on ongoing concern.  Pt seen 09/04/21 for R AOM.  Completed course of Augmentin with continued symptoms.  Pt with decreased hearing in R ear.  Denies pain, fever, chills, sore throat, HA, cough, rhinorrhea, ear pressure. ? ?Past Medical History:  ?Diagnosis Date  ? Compression fracture of lumbar vertebra ([facility])   ? Hypertension   ? ? ?No Known Allergies ? ?ROS ?General: Denies fever, chills, night sweats, changes in weight, changes in appetite ?HEENT: Denies headaches, ear pain, changes in vision, rhinorrhea, sore throat  +R ear pain and decreased hearing ?CV: Denies CP, palpitations, SOB, orthopnea ?Pulm: Denies SOB, cough, wheezing ?GI: Denies abdominal pain, nausea, vomiting, diarrhea, constipation ?GU: Denies dysuria, hematuria, frequency, vaginal discharge ?Msk: Denies muscle cramps, joint pains ?Neuro: Denies weakness, numbness, tingling ?Skin: Denies rashes, bruising ?Psych: Denies depression, anxiety, hallucinations ? ?   ?Objective:  ?  ?Blood pressure (!) 162/103, pulse (!) 105, temperature 99 ?F (37.2 ?C), temperature source Oral, weight 139 lb 6.4 oz (63.2 kg), SpO2 99 %. ? ?Gen. Pleasant, well-nourished, in no distress, normal affect   ?HEENT: [place]/AT, face symmetric, conjunctiva clear, no scleral icterus, PERRLA, EOMI, nares patent without drainage, L TM normal.  Normal external ears, no TTP of external ears.  R TM full with suppurative fluid and erythema. ?Lungs: no accessory muscle use, CTAB, no wheezes or rales ?Cardiovascular: tachycardia, no m/r/g, no peripheral edema ?Musculoskeletal: No deformities, no cyanosis or clubbing, normal tone ?Neuro:   A&Ox3, CN II-XII intact, normal gait ?Skin:  Warm, no lesions/ rash ? ? ?Wt Readings from Last 3 Encounters:  ?09/04/21 133 lb 12.8 oz (60.7 kg)  ?06/17/21 130 lb 9.6 oz (59.2 kg)  ?07/23/20 132 lb 12.8 oz (60.2 kg)  ? ? ?Lab Results  ?Component Value Date  ? WBC 13.6 (H) 01/10/2018  ? HGB 12.8 01/10/2018  ? HCT 39.0 01/10/2018  ? PLT 265 01/10/2018  ? GLUCOSE 88 07/23/2020  ? CHOL 175 09/16/2016  ? TRIG 89.0 09/16/2016  ? HDL 64.30 09/16/2016  ? LDLCALC 93 09/16/2016  ? ALT 13 09/16/2016  ? AST 19 09/16/2016  ? NA 133 (L) 07/23/2020  ? K 3.6 07/23/2020  ? CL 95 (L) 07/23/2020  ? CREATININE 0.76 07/23/2020  ? BUN 11 07/23/2020  ? CO2 29 07/23/2020  ? TSH 0.77 09/16/2016  ? ? ?Assessment/Plan: ? ?Recurrent acute serous otitis media of right ear  ?-failed abx treatment with Augmentin ?-start omnicef 300 mg BID ?-supportive care including tylenol prn for pain/discomfort ?-for continued symptoms refer to ENT. ?- Plan: cefdinir (OMNICEF) 300 MG capsule ? ?Essential hypertension ?-elevated likely 2/2 pain and anxiety from R AOM ?-recheck bp ?-continue current meds HCTZ 25 mg, ramipril 10 mg ?-for continued elevation >140/90 adjust medication regimen ?-lifestyle modifications ? ?F/u prn ? ?Abbe Amsterdam, MD ?

## 2021-10-28 DIAGNOSIS — Z79899 Other long term (current) drug therapy: Secondary | ICD-10-CM | POA: Diagnosis not present

## 2021-10-28 DIAGNOSIS — M961 Postlaminectomy syndrome, not elsewhere classified: Secondary | ICD-10-CM | POA: Diagnosis not present

## 2021-10-28 DIAGNOSIS — M5412 Radiculopathy, cervical region: Secondary | ICD-10-CM | POA: Diagnosis not present

## 2021-10-28 DIAGNOSIS — M5416 Radiculopathy, lumbar region: Secondary | ICD-10-CM | POA: Diagnosis not present

## 2021-11-19 DIAGNOSIS — M5412 Radiculopathy, cervical region: Secondary | ICD-10-CM | POA: Diagnosis not present

## 2022-03-02 DIAGNOSIS — Z5181 Encounter for therapeutic drug level monitoring: Secondary | ICD-10-CM | POA: Diagnosis not present

## 2022-03-02 DIAGNOSIS — M5412 Radiculopathy, cervical region: Secondary | ICD-10-CM | POA: Diagnosis not present

## 2022-03-02 DIAGNOSIS — Z79899 Other long term (current) drug therapy: Secondary | ICD-10-CM | POA: Diagnosis not present

## 2022-04-09 DIAGNOSIS — L600 Ingrowing nail: Secondary | ICD-10-CM | POA: Diagnosis not present

## 2022-05-19 DIAGNOSIS — Z1231 Encounter for screening mammogram for malignant neoplasm of breast: Secondary | ICD-10-CM | POA: Diagnosis not present

## 2022-05-19 DIAGNOSIS — N9089 Other specified noninflammatory disorders of vulva and perineum: Secondary | ICD-10-CM | POA: Diagnosis not present

## 2022-05-19 DIAGNOSIS — Z01419 Encounter for gynecological examination (general) (routine) without abnormal findings: Secondary | ICD-10-CM | POA: Diagnosis not present

## 2022-05-19 DIAGNOSIS — Z6825 Body mass index (BMI) 25.0-25.9, adult: Secondary | ICD-10-CM | POA: Diagnosis not present

## 2022-05-19 LAB — HM PAP SMEAR: HM Pap smear: NORMAL

## 2022-05-20 ENCOUNTER — Encounter: Payer: Self-pay | Admitting: Family Medicine

## 2022-06-08 DIAGNOSIS — M5416 Radiculopathy, lumbar region: Secondary | ICD-10-CM | POA: Diagnosis not present

## 2022-06-17 DIAGNOSIS — M5416 Radiculopathy, lumbar region: Secondary | ICD-10-CM | POA: Diagnosis not present

## 2022-08-13 ENCOUNTER — Other Ambulatory Visit: Payer: Self-pay | Admitting: Family Medicine

## 2022-08-13 ENCOUNTER — Ambulatory Visit: Payer: BC Managed Care – PPO | Admitting: Family Medicine

## 2022-08-13 VITALS — BP 168/109 | HR 124 | Temp 98.5°F | Ht 63.0 in | Wt 144.6 lb

## 2022-08-13 DIAGNOSIS — I10 Essential (primary) hypertension: Secondary | ICD-10-CM

## 2022-08-13 DIAGNOSIS — R232 Flushing: Secondary | ICD-10-CM | POA: Diagnosis not present

## 2022-08-13 DIAGNOSIS — Z87448 Personal history of other diseases of urinary system: Secondary | ICD-10-CM | POA: Diagnosis not present

## 2022-08-13 DIAGNOSIS — R000 Tachycardia, unspecified: Secondary | ICD-10-CM

## 2022-08-13 DIAGNOSIS — Z720 Tobacco use: Secondary | ICD-10-CM

## 2022-08-13 LAB — CBC WITH DIFFERENTIAL/PLATELET
Basophils Absolute: 0 10*3/uL (ref 0.0–0.1)
Basophils Relative: 0.3 % (ref 0.0–3.0)
Eosinophils Absolute: 0.1 10*3/uL (ref 0.0–0.7)
Eosinophils Relative: 1.2 % (ref 0.0–5.0)
HCT: 37.2 % (ref 36.0–46.0)
Hemoglobin: 12.6 g/dL (ref 12.0–15.0)
Lymphocytes Relative: 25.1 % (ref 12.0–46.0)
Lymphs Abs: 2.3 10*3/uL (ref 0.7–4.0)
MCHC: 33.8 g/dL (ref 30.0–36.0)
MCV: 87.5 fl (ref 78.0–100.0)
Monocytes Absolute: 0.5 10*3/uL (ref 0.1–1.0)
Monocytes Relative: 5.3 % (ref 3.0–12.0)
Neutro Abs: 6.4 10*3/uL (ref 1.4–7.7)
Neutrophils Relative %: 68.1 % (ref 43.0–77.0)
Platelets: 275 10*3/uL (ref 150.0–400.0)
RBC: 4.25 Mil/uL (ref 3.87–5.11)
RDW: 14.9 % (ref 11.5–15.5)
WBC: 9.3 10*3/uL (ref 4.0–10.5)

## 2022-08-13 LAB — COMPREHENSIVE METABOLIC PANEL
ALT: 12 U/L (ref 0–35)
AST: 19 U/L (ref 0–37)
Albumin: 4.5 g/dL (ref 3.5–5.2)
Alkaline Phosphatase: 50 U/L (ref 39–117)
BUN: 17 mg/dL (ref 6–23)
CO2: 26 mEq/L (ref 19–32)
Calcium: 8.8 mg/dL (ref 8.4–10.5)
Chloride: 100 mEq/L (ref 96–112)
Creatinine, Ser: 0.79 mg/dL (ref 0.40–1.20)
GFR: 90.68 mL/min (ref >60.00)
Glucose, Bld: 92 mg/dL (ref 70–99)
Potassium: 4.1 mEq/L (ref 3.5–5.1)
Sodium: 136 mEq/L (ref 135–145)
Total Bilirubin: 0.6 mg/dL (ref 0.2–1.2)
Total Protein: 7.5 g/dL (ref 6.0–8.3)

## 2022-08-13 LAB — POCT URINALYSIS DIPSTICK
Bilirubin, UA: NEGATIVE
Blood, UA: POSITIVE
Glucose, UA: NEGATIVE
Ketones, UA: NEGATIVE
Leukocytes, UA: NEGATIVE
Nitrite, UA: NEGATIVE
Protein, UA: POSITIVE — AB
Spec Grav, UA: 1.02 (ref 1.010–1.025)
Urobilinogen, UA: 0.2 E.U./dL
pH, UA: 6 (ref 5.0–8.0)

## 2022-08-13 LAB — LIPID PANEL
Cholesterol: 168 mg/dL (ref 0–200)
HDL: 72.3 mg/dL (ref >39.00)
LDL Cholesterol: 61 mg/dL (ref 0–99)
NonHDL: 95.78
Total CHOL/HDL Ratio: 2
Triglycerides: 173 mg/dL — ABNORMAL HIGH (ref 0.0–149.0)
VLDL: 34.6 mg/dL (ref 0.0–40.0)

## 2022-08-13 LAB — T4, FREE: Free T4: 0.76 ng/dL (ref 0.60–1.60)

## 2022-08-13 LAB — TSH: TSH: 0.88 u[IU]/mL (ref 0.35–5.50)

## 2022-08-13 MED ORDER — CARVEDILOL 3.125 MG PO TABS: 3.1250 mg | ORAL_TABLET | Freq: Two times a day (BID) | ORAL | 3 refills | Status: DC

## 2022-08-13 NOTE — Progress Notes (Signed)
Established Patient Office Visit   Subjective  Patient ID: [name], female    DOB: December 26, 1975  Age: 47 y.o. MRN: [id number]  Chief Complaint  Patient presents with   Medical Management of Chronic Issues    On BP    Pt is a 47 yo female with  has a past medical history of Compression fracture of lumbar vertebra ([place]), h/o hematuria, and Hypertension seen for f/u and med refill.  Pt last seen almost 1 yr ago,  Pt at home 160/100.  Taking HCTZ 25 mg and ramipril 10 mg.  Denies HA, CP, LE edema.  Pt stopped smoking, now vaping. Pt endorses occasional flushing.       ROS Negative unless stated above    Objective:     BP (!) 168/109 (BP Location: Right Arm, Cuff Size: Large)   Pulse (!) 124   Temp 98.5 F (36.9 C) (Oral)   Ht '5\' 3"'$  (1.6 m)   Wt 144 lb 9.6 oz (65.6 kg)   LMP 08/02/2022 (Exact Date)   SpO2 99%   BMI 25.61 kg/m    Physical Exam Vitals reviewed.  Constitutional:      General: She is not in acute distress.    Appearance: Normal appearance.  HENT:     Head: Normocephalic and atraumatic.     Nose: Nose normal.     Mouth/Throat:     Mouth: Mucous membranes are moist.  Eyes:     Extraocular Movements: Extraocular movements intact.     Conjunctiva/sclera: Conjunctivae normal.     Pupils: Pupils are equal, round, and reactive to light.  Cardiovascular:     Rate and Rhythm: Regular rhythm. Tachycardia present.     Heart sounds: Normal heart sounds. No murmur heard.    No friction rub. No gallop.  Pulmonary:     Effort: Pulmonary effort is normal. No respiratory distress.     Breath sounds: Normal breath sounds. No wheezing, rhonchi or rales.  Skin:    General: Skin is warm and dry.  Neurological:     Mental Status: She is alert and oriented to person, place, and time.     No results found for any visits on 08/13/22.    Assessment & Plan:  Tachycardia -HR 124 -likely multifactorial including HTN, anxiety, nicotine use,etc. -EKG  obtained and abnormal.  HR upper 90s LVH likely due to uncontrolled HTN.  R R' noted in V1 and V2.  Nonspecific p wave inversion in v2.  No ST elevation or T wave inversion. -given tachycardia will add beta blocker to regimen. -given strict precautions -     EKG 12-Lead -     Lipid panel -     TSH -     T4, free -     CBC with Differential/Platelet -     Comprehensive metabolic panel -     Ambulatory referral to Cardiology  Essential hypertension -uncontrolled. -Importance of lifestyle modifications -Start carvedilol 3.125 mg twice daily.  Will likely need to increase dose. -Continue current medications including HCTZ 25 mg daily and ramipril 10 mg daily -Continue monitoring BP at home and keeping a log of readings.  For consistent elevations greater than 140/90 notify clinic. -EKG obtained. -Discussed referral to cardiology -for continued elevation obtain renal artery u/s -smoking cessation encouraged. -given strict precautions -     EKG 12-Lead -     Carvedilol; Take 1 tablet (3.125 mg total) by mouth 2 (two) times daily with a meal.  Dispense: 60 tablet; Refill: 3 -     Comprehensive metabolic panel -     Ambulatory referral to Cardiology  Flushing -     TSH -     T4, free -     CBC with Differential/Platelet -     Comprehensive metabolic panel  Current every day nicotine vaping -congratulated on quitting cigarettes. -advised on need for vaping cessaiton -discussed ways to decrease stress -consider quit aids. -     CBC with Differential/Platelet  History of hematuria -obtain UA.  -for hematuria obtain renal imaging. -     POCT urinalysis dipstick   Return in about 4 weeks (around 09/10/2022), or if symptoms worsen or fail to improve.   Billie Ruddy, MD

## 2022-09-08 ENCOUNTER — Encounter: Payer: Self-pay | Admitting: Family Medicine

## 2022-09-17 ENCOUNTER — Encounter: Payer: Self-pay | Admitting: General Practice

## 2022-09-29 DIAGNOSIS — M5412 Radiculopathy, cervical region: Secondary | ICD-10-CM | POA: Diagnosis not present

## 2022-09-29 DIAGNOSIS — M961 Postlaminectomy syndrome, not elsewhere classified: Secondary | ICD-10-CM | POA: Diagnosis not present

## 2022-09-29 DIAGNOSIS — M48062 Spinal stenosis, lumbar region with neurogenic claudication: Secondary | ICD-10-CM | POA: Diagnosis not present

## 2022-09-29 DIAGNOSIS — M5416 Radiculopathy, lumbar region: Secondary | ICD-10-CM | POA: Diagnosis not present

## 2022-11-16 DIAGNOSIS — M5416 Radiculopathy, lumbar region: Secondary | ICD-10-CM | POA: Diagnosis not present

## 2022-11-29 ENCOUNTER — Ambulatory Visit: Payer: BC Managed Care – PPO | Admitting: Family Medicine

## 2022-11-29 ENCOUNTER — Encounter: Payer: Self-pay | Admitting: Family Medicine

## 2022-11-29 VITALS — BP 164/102 | HR 117 | Temp 98.5°F | Wt 147.6 lb

## 2022-11-29 DIAGNOSIS — I10 Essential (primary) hypertension: Secondary | ICD-10-CM | POA: Diagnosis not present

## 2022-11-29 DIAGNOSIS — L255 Unspecified contact dermatitis due to plants, except food: Secondary | ICD-10-CM | POA: Diagnosis not present

## 2022-11-29 DIAGNOSIS — I160 Hypertensive urgency: Secondary | ICD-10-CM | POA: Diagnosis not present

## 2022-11-29 DIAGNOSIS — Z91199 Patient's noncompliance with other medical treatment and regimen due to unspecified reason: Secondary | ICD-10-CM

## 2022-11-29 MED ORDER — BENAZEPRIL HCL 20 MG PO TABS: 20.0000 mg | ORAL_TABLET | Freq: Every day | ORAL | 3 refills | Status: DC

## 2022-11-29 MED ORDER — PREDNISONE 10 MG PO TABS: ORAL_TABLET | ORAL | 0 refills | Status: DC

## 2022-11-29 NOTE — Progress Notes (Signed)
   Established Patient Office Visit   Subjective  Patient ID: [name], female    DOB: 1976/03/16  Age: 47 y.o. MRN: [id number]  Chief Complaint  Patient presents with   Poison Ivy    Started on Friday, on left arm, breast, stomach, rt arm. Has been putting calamine on it.     Patient is a 47 year old female with pmh for HTN, tobacco use who is seen for acute concern.  Patient endorses coming into contact with poison ivy on Friday after doing yard work.  Now has a large area on left arm, small areas starting on left breast, right wrist, and left abdomen.  Patient tried calamine lotion for symptoms.  Patient states for years she has been able to handle the plants without gloves and has not had any rash.  Patient states BP has been 150-160/120s at home.  Patient stopped taking Coreg 3.125 as she felt like it made her a "zombie".  Taking hydrochlorothiazide 25 mg and ramipril 10 mg daily.  Patient often takes 1 pill twice a day.  Poison Ivy      ROS Negative unless stated above    Objective:     BP (!) 164/102 (BP Location: Right Arm, Patient Position: Sitting, Cuff Size: Normal)   Pulse (!) 117   Temp 98.5 F (36.9 C) (Oral)   Wt 147 lb 9.6 oz (67 kg)   SpO2 98%   BMI 26.15 kg/m    Physical Exam Constitutional:      General: She is not in acute distress.    Appearance: Normal appearance.  HENT:     Head: Normocephalic and atraumatic.     Nose: Nose normal.     Mouth/Throat:     Mouth: Mucous membranes are moist.  Cardiovascular:     Rate and Rhythm: Normal rate.     Heart sounds: No murmur heard.    No gallop.  Pulmonary:     Effort: Pulmonary effort is normal. No respiratory distress.     Breath sounds: No wheezing, rhonchi or rales.  Skin:    General: Skin is warm and dry.       Neurological:     Mental Status: She is alert and oriented to person, place, and time.      No results found for any visits on 11/29/22.    Assessment & Plan:   Hypertensive urgency -Uncontrolled -Will discontinue Coreg 3.125 mg twice daily due to patient preference -Continue hydrochlorothiazide 25 mg daily -Will discontinue ramipril 10 mg. -Start benazepril 20 mg daily. -Lifestyle modifications including smoking/vaping cessation encouraged -Patient to monitor BP at home and keep a log to bring with her to clinic -Patient to follow-up in clinic in the next 3-4 weeks. -     Benazepril HCl; Take 1 tablet (20 mg total) by mouth daily.  Dispense: 30 tablet; Refill: 3  Toxicodendron dermatitis -Patient advised to avoid contact with poisonous plants and wear protective clothing -Prednisone taper -OTC topical medications -     predniSONE; Take 4 tabs every morning for 3 days, 3 tabs for 2 days, 2 tabs for 2 days, 1 tab for 1 day.  Dispense: 23 tablet; Refill: 0  Non-compliance -Patient advised on the importance of follow-up. -Cardiology referral previously placed however canceled as patient did not return phone calls.   Return in about 3 weeks (around 12/20/2022).   Deeann Saint, MD

## 2022-11-29 NOTE — Patient Instructions (Addendum)
Coreg and ramipril were discontinued.  A new prescription for benazepril 20 mg was sent to the pharmacy.  You can take benazepril with the hydrochlorothiazide 25 mg.  Monitor your blood pressure at home.  If needed the dose of benazepril can be increased to 40 mg once per day.    A prescription for prednisone taper was also sent to your pharmacy.  We will have you follow-up with for blood pressure in the next 3-4 weeks.  Sooner if needed.

## 2022-12-16 ENCOUNTER — Telehealth: Payer: Self-pay | Admitting: Family Medicine

## 2022-12-16 NOTE — Telephone Encounter (Signed)
Prescription Request  12/16/2022  LOV: 11/29/2022  What is the name of the medication or equipment?  benazepril (LOTENSIN) 20 MG tablet  Have you contacted your pharmacy to request a refill? Yes   Pt states she is taking this twice a day and now she needs an early refill, because she is out.   Which pharmacy would you like this sent to?  [facility] [place], [place] - [address] [place] [address] Phone: [phone] Fax: [phone]    Patient notified that their request is being sent to the clinical staff for review and that they should receive a response within 2 business days.   Please advise at Mobile [phone] (mobile)

## 2022-12-17 NOTE — Telephone Encounter (Signed)
Patient should be taking chlorthalidone 25 mg daily and benazepril 20 mg daily.  Neither of these medicines have been prescribed as twice a day.  Okay to refill med as prescribed.  Will discuss further at upcoming office visit.

## 2022-12-17 NOTE — Telephone Encounter (Signed)
Per pt instructions on AVS at last OFV:   "Coreg and ramipril were discontinued.  A new prescription for benazepril 20 mg was sent to the pharmacy.  You can take benazepril with the hydrochlorothiazide 25 mg.  Monitor your blood pressure at home.  If needed the dose of benazepril can be increased to 40 mg once per day. "       This is probably what pt is talking about regarding taking 2 pills, but again, nothing about BID.

## 2022-12-17 NOTE — Telephone Encounter (Signed)
Pt called to say she is very worried, because she is completely out of this medication.   Pt would like a call back to discuss.  Please advise.

## 2022-12-19 ENCOUNTER — Other Ambulatory Visit: Payer: Self-pay | Admitting: Family Medicine

## 2022-12-19 DIAGNOSIS — I160 Hypertensive urgency: Secondary | ICD-10-CM

## 2022-12-20 ENCOUNTER — Ambulatory Visit: Payer: BC Managed Care – PPO | Admitting: Family Medicine

## 2022-12-20 ENCOUNTER — Encounter: Payer: Self-pay | Admitting: Family Medicine

## 2022-12-20 VITALS — BP 188/108 | HR 106 | Temp 98.9°F | Ht 63.0 in | Wt 148.2 lb

## 2022-12-20 DIAGNOSIS — I10 Essential (primary) hypertension: Secondary | ICD-10-CM

## 2022-12-20 MED ORDER — BENAZEPRIL HCL 20 MG PO TABS: 20.0000 mg | ORAL_TABLET | Freq: Two times a day (BID) | ORAL | 3 refills | Status: DC

## 2022-12-20 MED ORDER — HYDROCHLOROTHIAZIDE 25 MG PO TABS: 25.0000 mg | ORAL_TABLET | Freq: Every day | ORAL | 3 refills | Status: DC

## 2022-12-20 NOTE — Progress Notes (Signed)
Established Patient Office Visit   Subjective  Patient ID: [name], female    DOB: 21-Jun-1976  Age: 47 y.o. MRN: [id number]  Chief Complaint  Patient presents with   Medical Management of Chronic Issues    Follow up on BP.     Patient is a 47 year old female seen for follow-up on HTN.  Patient states she has been out of BP medication x 2 days.  At last OFV started on benazepril 20 mg daily. Pt advised okay to increase dose to 40 mg daily if needed.  Pt increased dose but started taking med twice daily.  Also taking HCTZ 25 mg daily.  Since starting benazepril patient states she feels good.  No longer wakes up with heart racing.  BP at home now 120s-130s/80s.  BP previously 50s-160s over 100s when patient was on Coreg 3.125 mg daily and ramipril 10 mg daily.    Past Medical History:  Diagnosis Date   Compression fracture of lumbar vertebra (HCC)    Hypertension    Past Surgical History:  Procedure Laterality Date   BACK SURGERY     BACK SURGERY  07/2012   lumbar spinal fusion L4 - Elsner, MD   Social History   Tobacco Use   Smoking status: Every Day    Packs/day: 0.25    Years: 15.00    Additional pack years: 0.00    Total pack years: 3.75    Types: Cigarettes   Smokeless tobacco: Never   Tobacco comments:    4-5 cigarettes a day  Substance Use Topics   Alcohol use: No    Comment: rarely   Drug use: No   History reviewed. No pertinent family history. No Known Allergies    ROS Negative unless stated above    Objective:     BP (!) 188/108 (BP Location: Left Arm, Patient Position: Sitting, Cuff Size: Normal)   Pulse (!) 106   Temp 98.9 F (37.2 C) (Oral)   Ht 5\' 3"  (1.6 m)   Wt 148 lb 3.2 oz (67.2 kg)   LMP 11/29/2022 (Approximate)   SpO2 99%   BMI 26.25 kg/m    Physical Exam Constitutional:      General: She is not in acute distress.    Appearance: Normal appearance.  HENT:     Head: Normocephalic and atraumatic.     Nose: Nose normal.      Mouth/Throat:     Mouth: Mucous membranes are moist.  Cardiovascular:     Rate and Rhythm: Normal rate and regular rhythm.     Heart sounds: Normal heart sounds. No murmur heard.    No gallop.  Pulmonary:     Effort: Pulmonary effort is normal. No respiratory distress.     Breath sounds: Normal breath sounds. No wheezing, rhonchi or rales.  Skin:    General: Skin is warm and dry.  Neurological:     Mental Status: She is alert and oriented to person, place, and time.     No results found for any visits on 12/20/22.    Assessment & Plan:  Essential hypertension -     Benazepril HCl; Take 1 tablet (20 mg total) by mouth 2 (two) times daily.  Dispense: 180 tablet; Refill: 3 -     hydroCHLOROthiazide; Take 1 tablet (25 mg total) by mouth daily.  Dispense: 90 tablet; Refill: 3  Hypertensive urgency as pt out of medication x 2 days.  Will send in new Rx for benazepril 20 mg  twice daily and HCTZ 25 mg daily.  Continue lifestyle modifications.  Continue monitoring BP at home.  Patient advised to contact clinic for elevations consistently greater than 140/90.  Given strict precautions.  Return in about 4 months (around 04/21/2023).  Return in 4 months, sooner if needed.  Deeann Saint, MD

## 2022-12-20 NOTE — Telephone Encounter (Signed)
Pt discuss with provider at her appt today.

## 2023-01-31 ENCOUNTER — Telehealth (INDEPENDENT_AMBULATORY_CARE_PROVIDER_SITE_OTHER): Payer: BC Managed Care – PPO | Admitting: Family Medicine

## 2023-01-31 ENCOUNTER — Encounter: Payer: Self-pay | Admitting: Family Medicine

## 2023-01-31 DIAGNOSIS — M5412 Radiculopathy, cervical region: Secondary | ICD-10-CM | POA: Diagnosis not present

## 2023-01-31 DIAGNOSIS — Z79899 Other long term (current) drug therapy: Secondary | ICD-10-CM | POA: Diagnosis not present

## 2023-01-31 DIAGNOSIS — H1033 Unspecified acute conjunctivitis, bilateral: Secondary | ICD-10-CM

## 2023-01-31 DIAGNOSIS — I10 Essential (primary) hypertension: Secondary | ICD-10-CM

## 2023-01-31 DIAGNOSIS — M961 Postlaminectomy syndrome, not elsewhere classified: Secondary | ICD-10-CM | POA: Diagnosis not present

## 2023-01-31 MED ORDER — POLYMYXIN B-TRIMETHOPRIM 10000-0.1 UNIT/ML-% OP SOLN: 1.0000 [drp] | OPHTHALMIC | 0 refills | Status: DC

## 2023-01-31 NOTE — Progress Notes (Signed)
Virtual Visit via Video Note  I connected with [name] on 01/31/23 at  4:30 PM EDT by a video enabled telemedicine application and verified that I am speaking with the correct person using two identifiers.  Location patient: home Location provider:work or home office Persons participating in the virtual visit: patient, provider  I discussed the limitations of evaluation and management by telemedicine and the availability of in person appointments. The patient expressed understanding and agreed to proceed.  Chief Complaint  Patient presents with   Conjunctivitis    Finished family vacation and 2 of them had pink eye,. She woke up the am and eyes were crusted over. Yesterday she thought it may have been a stye, but the eyes started getting pink. Did try warm compress and has been avoiding touching her eyes      HPI: Patient is a 47 year old female seen for acute concern.  Pt returned from family vacation were several kids had pink eye.  Pt states yesterday she began having itching, pain, and redness in b/l eyes.  Pt woke up this am with eyes crusted and thick d/c.  Since last OFV bp med adjusted.  Now on Benazepril 20 mg BID and hydrochlorothiazide 25 mg daily.  Pt likes new bp meds, states bp now 130s/90 at home which is a bid improvement for her.  Pt denies any s/e from the medication. In the past coreg made pt feel like a zombie.  ROS: See pertinent positives and negatives per HPI.  Past Medical History:  Diagnosis Date   Compression fracture of lumbar vertebra (HCC)    Hypertension     Past Surgical History:  Procedure Laterality Date   BACK SURGERY     BACK SURGERY  07/2012   lumbar spinal fusion L4 - Elsner, MD    No family history on file.  Current Outpatient Medications:    benazepril (LOTENSIN) 20 MG tablet, Take 1 tablet (20 mg total) by mouth 2 (two) times daily., Disp: 180 tablet, Rfl: 3   hydrochlorothiazide (HYDRODIURIL) 25 MG tablet, Take 1 tablet (25 mg  total) by mouth daily., Disp: 90 tablet, Rfl: 3   ibuprofen (ADVIL,MOTRIN) 200 MG tablet, Take 800 mg by mouth every 6 (six) hours as needed (pain.)., Disp: , Rfl:    traMADol (ULTRAM) 50 MG tablet, Take 50-100 mg by mouth every 6 (six) hours as needed., Disp: , Rfl:   EXAM:    VITALS per patient if applicable: RR between 12-20 bpm  GENERAL: alert, oriented, appears well and in no acute distress  HEENT: b/l eye irritation.  atraumatic, conjunctiva clear, no obvious abnormalities on inspection of external nose and ears  NECK: normal movements of the head and neck  LUNGS: on inspection no signs of respiratory distress, breathing rate appears normal, no obvious gross SOB, gasping or wheezing  CV: no obvious cyanosis  MS: moves all visible extremities without noticeable abnormality  PSYCH/NEURO: pleasant and cooperative, no obvious depression or anxiety, speech and thought processing grossly intact  ASSESSMENT AND PLAN:  Discussed the following assessment and plan:  Acute bacterial conjunctivitis of both eyes - Plan: trimethoprim-polymyxin b (POLYTRIM) ophthalmic solution  Essential hypertension  Start abx eye gtts.  Hand hygiene.  Cool compresses.   Continue benazepril 20 mg BID and hydrochlorothiazide 25 mg daily.  Lifestyle modifications encouraged.   F/u prn for continued or worsened symptoms.   I discussed the assessment and treatment plan with the patient. The patient was provided an opportunity to ask questions  and all were answered. The patient agreed with the plan and demonstrated an understanding of the instructions.   The patient was advised to call back or seek an in-person evaluation if the symptoms worsen or if the condition fails to improve as anticipated.   Deeann Saint, MD

## 2023-02-08 DIAGNOSIS — Z5181 Encounter for therapeutic drug level monitoring: Secondary | ICD-10-CM | POA: Diagnosis not present

## 2023-02-08 DIAGNOSIS — Z79899 Other long term (current) drug therapy: Secondary | ICD-10-CM | POA: Diagnosis not present

## 2023-02-08 DIAGNOSIS — M5412 Radiculopathy, cervical region: Secondary | ICD-10-CM | POA: Diagnosis not present

## 2023-04-07 ENCOUNTER — Encounter: Payer: Self-pay | Admitting: Family Medicine

## 2023-06-06 DIAGNOSIS — M5412 Radiculopathy, cervical region: Secondary | ICD-10-CM | POA: Diagnosis not present

## 2023-06-06 DIAGNOSIS — M961 Postlaminectomy syndrome, not elsewhere classified: Secondary | ICD-10-CM | POA: Diagnosis not present

## 2023-06-06 DIAGNOSIS — Z79899 Other long term (current) drug therapy: Secondary | ICD-10-CM | POA: Diagnosis not present

## 2023-06-28 DIAGNOSIS — M5416 Radiculopathy, lumbar region: Secondary | ICD-10-CM | POA: Diagnosis not present

## 2023-08-08 DIAGNOSIS — Z01419 Encounter for gynecological examination (general) (routine) without abnormal findings: Secondary | ICD-10-CM | POA: Diagnosis not present

## 2023-08-08 DIAGNOSIS — Z6826 Body mass index (BMI) 26.0-26.9, adult: Secondary | ICD-10-CM | POA: Diagnosis not present

## 2023-08-08 DIAGNOSIS — Z1151 Encounter for screening for human papillomavirus (HPV): Secondary | ICD-10-CM | POA: Diagnosis not present

## 2023-08-08 DIAGNOSIS — Z124 Encounter for screening for malignant neoplasm of cervix: Secondary | ICD-10-CM | POA: Diagnosis not present

## 2023-08-08 DIAGNOSIS — Z1231 Encounter for screening mammogram for malignant neoplasm of breast: Secondary | ICD-10-CM | POA: Diagnosis not present

## 2023-09-15 DIAGNOSIS — M5412 Radiculopathy, cervical region: Secondary | ICD-10-CM | POA: Diagnosis not present

## 2023-09-15 DIAGNOSIS — M961 Postlaminectomy syndrome, not elsewhere classified: Secondary | ICD-10-CM | POA: Diagnosis not present

## 2023-09-15 DIAGNOSIS — M5416 Radiculopathy, lumbar region: Secondary | ICD-10-CM | POA: Diagnosis not present

## 2023-11-03 DIAGNOSIS — M5412 Radiculopathy, cervical region: Secondary | ICD-10-CM | POA: Diagnosis not present

## 2023-11-10 DIAGNOSIS — M5416 Radiculopathy, lumbar region: Secondary | ICD-10-CM | POA: Diagnosis not present

## 2023-12-15 DIAGNOSIS — M5416 Radiculopathy, lumbar region: Secondary | ICD-10-CM | POA: Diagnosis not present

## 2024-01-17 ENCOUNTER — Other Ambulatory Visit: Payer: Self-pay | Admitting: Family Medicine

## 2024-01-17 DIAGNOSIS — I10 Essential (primary) hypertension: Secondary | ICD-10-CM

## 2024-01-17 NOTE — Telephone Encounter (Unsigned)
 Copied from CRM [id number]. Topic: Clinical - Medication Refill >> Jan 17, 2024 10:21 AM Suzen RAMAN wrote: Medication: benazepril  (LOTENSIN ) 20 MG tablet hydrochlorothiazide  (HYDRODIURIL ) 25 MG tablet   Has the patient contacted their pharmacy? Yes   This is the patient's preferred pharmacy:  [facility] [place], [place] - [address] [facility] [address] [place] [address] Phone: [phone] Fax: [phone]  Is this the correct pharmacy for this prescription? Yes If no, delete pharmacy and type the correct one.   Has the prescription been filled recently? No  Is the patient out of the medication? Yes  Has the patient been seen for an appointment in the last year OR does the patient have an upcoming appointment? Yes  Can we respond through MyChart? Yes  Agent: Please be advised that Rx refills may take up to 3 business days. We ask that you follow-up with your pharmacy.

## 2024-01-18 MED ORDER — BENAZEPRIL HCL 20 MG PO TABS: 20.0000 mg | ORAL_TABLET | Freq: Two times a day (BID) | ORAL | 3 refills | Status: DC

## 2024-01-18 MED ORDER — HYDROCHLOROTHIAZIDE 25 MG PO TABS: 25.0000 mg | ORAL_TABLET | Freq: Every day | ORAL | 3 refills | Status: DC

## 2024-02-01 ENCOUNTER — Ambulatory Visit: Admitting: Family Medicine

## 2024-02-01 ENCOUNTER — Encounter: Payer: Self-pay | Admitting: Family Medicine

## 2024-02-01 VITALS — BP 162/100 | HR 110 | Temp 98.6°F | Ht 63.0 in | Wt 152.8 lb

## 2024-02-01 DIAGNOSIS — I10 Essential (primary) hypertension: Secondary | ICD-10-CM | POA: Diagnosis not present

## 2024-02-01 MED ORDER — AMLODIPINE BESYLATE 5 MG PO TABS: 5.0000 mg | ORAL_TABLET | Freq: Every day | ORAL | 1 refills | Status: AC

## 2024-02-01 MED ORDER — HYDROCHLOROTHIAZIDE 25 MG PO TABS: 25.0000 mg | ORAL_TABLET | Freq: Every day | ORAL | 1 refills | Status: AC

## 2024-02-01 MED ORDER — BENAZEPRIL HCL 20 MG PO TABS: 20.0000 mg | ORAL_TABLET | Freq: Two times a day (BID) | ORAL | 1 refills | Status: AC

## 2024-02-01 NOTE — Progress Notes (Addendum)
 Established Patient Office Visit   Subjective  Patient ID: [name], female    DOB: 1975-09-25  Age: 48 y.o. MRN: [id number]  Chief Complaint  Patient presents with   Medical Management of Chronic Issues    Patient came in today for medication refill     Pt is a 48 year old female seen for repeat refills on BP medication.  Patient taking benazepril  20 mg twice daily and HCTZ 25 mg daily.  Patient does not have log of home readings.  Denies headaches, CP, changes in vision, LE edema.  States feels fine and BP is much better than it has been in in the past.  Patient endorses family history of difficult to control blood pressure.  Patient request not to be put on a beta-blocker as it made her feel like she was dragging in the past.    Patient Active Problem List   Diagnosis Date Noted   Syncope 03/08/2013   Headache 03/08/2013   Normal vaginal delivery 01/29/2011   ROM 10/27/2007   Essential hypertension 10/27/2007   Past Medical History:  Diagnosis Date   Compression fracture of lumbar vertebra (HCC)    Hypertension    Past Surgical History:  Procedure Laterality Date   BACK SURGERY     BACK SURGERY  07/2012   lumbar spinal fusion L4 - Elsner, MD   Social History   Tobacco Use   Smoking status: Every Day    Current packs/day: 0.25    Average packs/day: 0.3 packs/day for 15.0 years (3.8 ttl pk-yrs)    Types: Cigarettes   Smokeless tobacco: Never   Tobacco comments:    4-5 cigarettes a day  Substance Use Topics   Alcohol use: No    Comment: rarely   Drug use: No   History reviewed. No pertinent family history. No Known Allergies  ROS Negative unless stated above    Objective:     BP (!) 162/100 (BP Location: Right Arm, Patient Position: Sitting, Cuff Size: Normal)   Pulse (!) 110   Temp 98.6 F (37 C) (Oral)   Ht 5' 3 (1.6 m)   Wt 152 lb 12.8 oz (69.3 kg)   LMP 01/23/2024   SpO2 99%   BMI 27.07 kg/m  BP Readings from Last 3 Encounters:   02/01/24 (!) 164/90  12/20/22 (!) 188/108  11/29/22 (!) 164/102   Wt Readings from Last 3 Encounters:  02/01/24 152 lb 12.8 oz (69.3 kg)  12/20/22 148 lb 3.2 oz (67.2 kg)  11/29/22 147 lb 9.6 oz (67 kg)      Physical Exam Constitutional:      General: She is not in acute distress.    Appearance: Normal appearance.  HENT:     Head: Normocephalic and atraumatic.     Nose: Nose normal.     Mouth/Throat:     Mouth: Mucous membranes are moist.  Cardiovascular:     Rate and Rhythm: Normal rate and regular rhythm.     Heart sounds: Normal heart sounds. No murmur heard.    No gallop.  Pulmonary:     Effort: Pulmonary effort is normal. No respiratory distress.     Breath sounds: Normal breath sounds. No wheezing, rhonchi or rales.  Skin:    General: Skin is warm and dry.  Neurological:     Mental Status: She is alert and oriented to person, place, and time.        02/01/2024   10:38 AM 11/29/2022    3:13  PM 08/13/2022   10:44 AM  Depression screen PHQ 2/9  Decreased Interest 0 0 0  Down, Depressed, Hopeless 0 0 0  PHQ - 2 Score 0 0 0  Altered sleeping 0 0 0  Tired, decreased energy 0 0 0  Change in appetite 0 0 0  Feeling bad or failure about yourself  0 0 0  Trouble concentrating 0 0 0  Moving slowly or fidgety/restless 0 0 0  Suicidal thoughts 0 0 0  PHQ-9 Score 0 0 0      02/01/2024   10:38 AM 11/29/2022    3:14 PM 08/13/2022   10:44 AM  GAD 7 : Generalized Anxiety Score  Nervous, Anxious, on Edge 0 0 2  Control/stop worrying 0 0 2  Worry too much - different things 0 0 2  Trouble relaxing 0 0 2  Restless 0 0 1  Easily annoyed or irritable 0 0 0  Afraid - awful might happen 0 0 0  Total GAD 7 Score 0 0 9  Anxiety Difficulty   Somewhat difficult     No results found for any visits on 02/01/24.    Assessment & Plan:   Essential hypertension  BP uncontrolled.  Recheck remains elevated.  Start Norvasc  5 mg daily.  Continue benazepril  20 mg twice daily and  HCTZ 25 mg daily.  Patient would like to avoid beta-blocker given prior history of making her feel tired/like she was dragging.  Like she was dragging. like she  was dragging.  Will have patient set up CPE in the next few weeks and obtain labs at that visit.  Continue decreasing sodium intake and increasing physical activity.  For continued/difficult to control BP refer to hypertension clinic.  Return in about 4 weeks (around 02/29/2024) for physical.   Clotilda JONELLE Single, MD

## 2024-02-01 NOTE — Patient Instructions (Addendum)
 A prescription for amlodipine  (Norvasc ) 5 mg was sent to your pharmacy. This is a new medication to go along with your current medications, Benazepril  20  mg twice a day and hydrochlorothiazide  25 mg daily.  We will check labs in the next few wks at your physical.  Check your blood pressure at home.

## 2024-02-29 ENCOUNTER — Ambulatory Visit: Payer: Self-pay | Admitting: Family Medicine

## 2024-02-29 ENCOUNTER — Encounter: Payer: Self-pay | Admitting: Family Medicine

## 2024-02-29 ENCOUNTER — Ambulatory Visit: Admitting: Family Medicine

## 2024-02-29 VITALS — BP 134/80 | HR 115 | Temp 98.4°F | Ht 63.0 in | Wt 150.8 lb

## 2024-02-29 DIAGNOSIS — Z131 Encounter for screening for diabetes mellitus: Secondary | ICD-10-CM

## 2024-02-29 DIAGNOSIS — I10 Essential (primary) hypertension: Secondary | ICD-10-CM

## 2024-02-29 DIAGNOSIS — D649 Anemia, unspecified: Secondary | ICD-10-CM

## 2024-02-29 DIAGNOSIS — Z0000 Encounter for general adult medical examination without abnormal findings: Secondary | ICD-10-CM | POA: Diagnosis not present

## 2024-02-29 DIAGNOSIS — Z13220 Encounter for screening for lipoid disorders: Secondary | ICD-10-CM | POA: Diagnosis not present

## 2024-02-29 LAB — CBC WITH DIFFERENTIAL/PLATELET
Basophils Absolute: 0 K/uL (ref 0.0–0.1)
Basophils Relative: 0.3 % (ref 0.0–3.0)
Eosinophils Absolute: 0.2 K/uL (ref 0.0–0.7)
Eosinophils Relative: 2.3 % (ref 0.0–5.0)
HCT: 34.2 % — ABNORMAL LOW (ref 36.0–46.0)
Hemoglobin: 11.4 g/dL — ABNORMAL LOW (ref 12.0–15.0)
Lymphocytes Relative: 26.5 % (ref 12.0–46.0)
Lymphs Abs: 2.2 K/uL (ref 0.7–4.0)
MCHC: 33.2 g/dL (ref 30.0–36.0)
MCV: 88.3 fl (ref 78.0–100.0)
Monocytes Absolute: 0.5 K/uL (ref 0.1–1.0)
Monocytes Relative: 6.3 % (ref 3.0–12.0)
Neutro Abs: 5.3 K/uL (ref 1.4–7.7)
Neutrophils Relative %: 64.6 % (ref 43.0–77.0)
Platelets: 324 K/uL (ref 150.0–400.0)
RBC: 3.88 Mil/uL (ref 3.87–5.11)
RDW: 15.1 % (ref 11.5–15.5)
WBC: 8.2 K/uL (ref 4.0–10.5)

## 2024-02-29 LAB — TSH: TSH: 1.49 u[IU]/mL (ref 0.35–5.50)

## 2024-02-29 LAB — COMPREHENSIVE METABOLIC PANEL WITH GFR
ALT: 11 U/L (ref 0–35)
AST: 17 U/L (ref 0–37)
Albumin: 4.2 g/dL (ref 3.5–5.2)
Alkaline Phosphatase: 50 U/L (ref 39–117)
BUN: 17 mg/dL (ref 6–23)
CO2: 28 meq/L (ref 19–32)
Calcium: 8.8 mg/dL (ref 8.4–10.5)
Chloride: 101 meq/L (ref 96–112)
Creatinine, Ser: 0.73 mg/dL (ref 0.40–1.20)
GFR: 98.62 mL/min (ref >60.00)
Glucose, Bld: 93 mg/dL (ref 70–99)
Potassium: 3.9 meq/L (ref 3.5–5.1)
Sodium: 136 meq/L (ref 135–145)
Total Bilirubin: 0.3 mg/dL (ref 0.2–1.2)
Total Protein: 7.1 g/dL (ref 6.0–8.3)

## 2024-02-29 LAB — HEMOGLOBIN A1C: Hgb A1c MFr Bld: 5.4 % (ref 4.6–6.5)

## 2024-02-29 LAB — LIPID PANEL
Cholesterol: 148 mg/dL (ref 0–200)
HDL: 67 mg/dL (ref >39.00)
LDL Cholesterol: 42 mg/dL (ref 0–99)
NonHDL: 80.55
Total CHOL/HDL Ratio: 2
Triglycerides: 193 mg/dL — ABNORMAL HIGH (ref 0.0–149.0)
VLDL: 38.6 mg/dL (ref 0.0–40.0)

## 2024-02-29 LAB — T4, FREE: Free T4: 0.69 ng/dL (ref 0.60–1.60)

## 2024-02-29 NOTE — Progress Notes (Signed)
 Established Patient Office Visit   Subjective  Patient ID: [name], female    DOB: 1976-04-30  Age: 48 y.o. MRN: [id number]  Chief Complaint  Patient presents with   Annual Exam    Pt is a 48 yo female seen for CPE and f/u on HTN.  Recently started on Norvasc  5 mg daily along with lisinopril 20 mg BID and hydrochlorothiazide  12.5 mg daily.  BP remained elevated the first 2 days of starting norvasc , then improved.  Pt no longer checking bp due to it being better, 120s-130s/80.  Denies HAs, LE edema, dizziness, CP. Pt had mammogram and pap with [facility], Dr. Johnnye.  Not sure if she wants colon cancer screening wishes to speak with her sister who is a Teacher, early years/pre.  Patient denies family history of colon cancer.  States her mother had a history of lung cancer with brain mets but was a smoker.    Patient Active Problem List   Diagnosis Date Noted   Syncope 03/08/2013   Headache 03/08/2013   Normal vaginal delivery 01/29/2011   ROM 10/27/2007   Essential hypertension 10/27/2007   Past Medical History:  Diagnosis Date   Compression fracture of lumbar vertebra (HCC)    Hypertension    Past Surgical History:  Procedure Laterality Date   BACK SURGERY     BACK SURGERY  07/2012   lumbar spinal fusion L4 - Elsner, MD   Social History   Tobacco Use   Smoking status: Every Day    Current packs/day: 0.25    Average packs/day: 0.3 packs/day for 15.0 years (3.8 ttl pk-yrs)    Types: Cigarettes   Smokeless tobacco: Never   Tobacco comments:    4-5 cigarettes a day  Substance Use Topics   Alcohol use: No    Comment: rarely   Drug use: No   History reviewed. No pertinent family history. No Known Allergies  ROS Negative unless stated above    Objective:     BP 134/80 (BP Location: Left Arm, Patient Position: Sitting, Cuff Size: Normal)   Pulse (!) 115   Temp 98.4 F (36.9 C) (Oral)   Ht 5' 3 (1.6 m)   Wt 150 lb 12.8 oz (68.4 kg)   LMP 01/23/2024    SpO2 99%   BMI 26.71 kg/m  BP Readings from Last 3 Encounters:  02/29/24 134/80  02/01/24 (!) 162/100  12/20/22 (!) 188/108   Wt Readings from Last 3 Encounters:  02/29/24 150 lb 12.8 oz (68.4 kg)  02/01/24 152 lb 12.8 oz (69.3 kg)  12/20/22 148 lb 3.2 oz (67.2 kg)      Physical Exam Constitutional:      Appearance: Normal appearance.  HENT:     Head: Normocephalic and atraumatic.     Right Ear: Tympanic membrane, ear canal and external ear normal.     Left Ear: Tympanic membrane, ear canal and external ear normal.     Nose: Nose normal.     Mouth/Throat:     Mouth: Mucous membranes are moist.     Pharynx: No oropharyngeal exudate or posterior oropharyngeal erythema.  Eyes:     General: No scleral icterus.    Extraocular Movements: Extraocular movements intact.     Conjunctiva/sclera: Conjunctivae normal.     Pupils: Pupils are equal, round, and reactive to light.  Neck:     Thyroid: No thyromegaly.     Vascular: No carotid bruit.  Cardiovascular:     Rate and Rhythm: Normal  rate and regular rhythm.     Pulses: Normal pulses.     Heart sounds: Normal heart sounds. No murmur heard.    No friction rub.  Pulmonary:     Effort: Pulmonary effort is normal.     Breath sounds: Normal breath sounds. No wheezing, rhonchi or rales.  Abdominal:     General: Bowel sounds are normal.     Palpations: Abdomen is soft.     Tenderness: There is no abdominal tenderness.  Musculoskeletal:        General: No deformity. Normal range of motion.  Lymphadenopathy:     Cervical: No cervical adenopathy.  Skin:    General: Skin is warm and dry.     Findings: No lesion.  Neurological:     General: No focal deficit present.     Mental Status: She is alert and oriented to person, place, and time.  Psychiatric:        Mood and Affect: Mood normal.        Thought Content: Thought content normal.        02/29/2024   11:37 AM 02/01/2024   10:38 AM 11/29/2022    3:13 PM  Depression  screen PHQ 2/9  Decreased Interest 0 0 0  Down, Depressed, Hopeless 0 0 0  PHQ - 2 Score 0 0 0  Altered sleeping 0 0 0  Tired, decreased energy 0 0 0  Change in appetite 0 0 0  Feeling bad or failure about yourself  0 0 0  Trouble concentrating 0 0 0  Moving slowly or fidgety/restless 0 0 0  Suicidal thoughts 0 0 0  PHQ-9 Score 0 0 0  Difficult doing work/chores Not difficult at all        02/29/2024   11:37 AM 02/01/2024   10:38 AM 11/29/2022    3:14 PM 08/13/2022   10:44 AM  GAD 7 : Generalized Anxiety Score  Nervous, Anxious, on Edge 0 0 0 2  Control/stop worrying 0 0 0 2  Worry too much - different things 0 0 0 2  Trouble relaxing 0 0 0 2  Restless 0 0 0 1  Easily annoyed or irritable 0 0 0 0  Afraid - awful might happen 0 0 0 0  Total GAD 7 Score 0 0 0 9  Anxiety Difficulty Not difficult at all   Somewhat difficult     No results found for any visits on 02/29/24.    Assessment & Plan:   Well adult exam -     CBC with Differential/Platelet; Future -     Comprehensive metabolic panel with GFR; Future -     Hemoglobin A1c; Future -     Lipid panel; Future -     T4, free; Future -     TSH; Future  Essential hypertension -     Comprehensive metabolic panel with GFR; Future -     T4, free; Future -     TSH; Future  Age-appropriate health screenings discussed.  Obtain labs.  Immunizations reviewed.  Influenza vaccine recommended, pt declines at this time.  Mammogram and Pap up-to-date per patient.  Will obtain information from [facility].  Discussed colon cancer screening colonoscopy versus Cologuard.  Patient declines at this time.  Will contact clinic when/if decides on screening method she would like to pursue.  BP well-controlled.  Continue lisinopril 20 mg twice daily, HCTZ 12.5 mg daily  Return in about 1 year (around 02/28/2025) for physical.  Sooner if needed.   Clotilda JONELLE Single, MD

## 2024-03-02 ENCOUNTER — Telehealth: Payer: Self-pay

## 2024-03-02 NOTE — Telephone Encounter (Signed)
 See lab result note.

## 2024-03-02 NOTE — Telephone Encounter (Signed)
 Copied from CRM #[id number]. Topic: General - Other >> Mar 02, 2024  2:13 PM Turkey A wrote: Reason for CRM: Patient was returning call to [facility] call patient

## 2024-03-16 ENCOUNTER — Ambulatory Visit: Admitting: Internal Medicine

## 2024-03-16 ENCOUNTER — Encounter: Payer: Self-pay | Admitting: Internal Medicine

## 2024-03-16 VITALS — BP 128/92 | HR 108 | Temp 98.9°F | Ht 63.0 in | Wt 148.4 lb

## 2024-03-16 DIAGNOSIS — D649 Anemia, unspecified: Secondary | ICD-10-CM

## 2024-03-16 DIAGNOSIS — J019 Acute sinusitis, unspecified: Secondary | ICD-10-CM

## 2024-03-16 DIAGNOSIS — I10 Essential (primary) hypertension: Secondary | ICD-10-CM | POA: Diagnosis not present

## 2024-03-16 MED ORDER — HYDROCODONE BIT-HOMATROP MBR 5-1.5 MG/5ML PO SOLN: 5.0000 mL | Freq: Four times a day (QID) | ORAL | 0 refills | Status: AC | PRN

## 2024-03-16 MED ORDER — AMOXICILLIN-POT CLAVULANATE 875-125 MG PO TABS: 1.0000 | ORAL_TABLET | Freq: Two times a day (BID) | ORAL | 0 refills | Status: AC

## 2024-03-16 NOTE — Assessment & Plan Note (Signed)
 Pt to continue increased iron diet, declines f/u lab for now

## 2024-03-16 NOTE — Progress Notes (Signed)
 Patient ID: [name], female   DOB: 1976-05-16, 48 y.o.   MRN: [id number]        Chief Complaint: follow up acute sinusitis, htn, anemia       HPI:  [name] is a 48 y.o. female  Here with 2-3 days acute onset fever, facial pain, pressure, headache, general weakness and malaise, and greenish d/c, with mild ST and cough, but pt denies chest pain, wheezing, increased sob or doe, orthopnea, PND, increased LE swelling, palpitations, dizziness or syncope.  BP much improved on recent start current BP meds.  No overt bleeding, had mild anemia on last labs, normocytic, and advised to increase iron in diet.  Decilnes f/u lab today       Wt Readings from Last 3 Encounters:  03/16/24 148 lb 6.4 oz (67.3 kg)  02/29/24 150 lb 12.8 oz (68.4 kg)  02/01/24 152 lb 12.8 oz (69.3 kg)   BP Readings from Last 3 Encounters:  03/16/24 (!) 128/92  02/29/24 134/80  02/01/24 (!) 162/100         Past Medical History:  Diagnosis Date   Compression fracture of lumbar vertebra (HCC)    Hypertension    Past Surgical History:  Procedure Laterality Date   BACK SURGERY     BACK SURGERY  07/2012   lumbar spinal fusion L4 - Elsner, MD    reports that she has been smoking cigarettes. She has a 3.8 pack-year smoking history. She has never used smokeless tobacco. She reports that she does not drink alcohol and does not use drugs. family history is not on file. No Known Allergies Current Outpatient Medications on File Prior to Visit  Medication Sig Dispense Refill   amLODipine  (NORVASC ) 5 MG tablet Take 1 tablet (5 mg total) by mouth daily. 90 tablet 1   benazepril  (LOTENSIN ) 20 MG tablet Take 1 tablet (20 mg total) by mouth 2 (two) times daily. 180 tablet 1   hydrochlorothiazide  (HYDRODIURIL ) 25 MG tablet Take 1 tablet (25 mg total) by mouth daily. 90 tablet 1   ibuprofen  (ADVIL ,MOTRIN ) 200 MG tablet Take 800 mg by mouth every 6 (six) hours as needed (pain.).     traMADol (ULTRAM) 50 MG tablet Take  50-100 mg by mouth every 6 (six) hours as needed.     No current facility-administered medications on file prior to visit.        ROS:  All others reviewed and negative.  Objective        PE:  BP (!) 128/92   Pulse (!) 108   Temp 98.9 F (37.2 C)   Ht 5' 3 (1.6 m)   Wt 148 lb 6.4 oz (67.3 kg)   SpO2 99%   BMI 26.29 kg/m                 Constitutional: Pt appears mild ill               HENT: Head: NCAT.                Right Ear: External ear normal.                 Left Ear: External ear normal. Bilat tm's with mild erythema.  Max sinus areas mild tender.  Pharynx with mild erythema, no exudate               Eyes: . Pupils are equal, round, and reactive to light. Conjunctivae and EOM are normal  Nose: without d/c or deformity               Neck: Neck supple. Gross normal ROM               Cardiovascular: Normal rate and regular rhythm.                 Pulmonary/Chest: Effort normal and breath sounds without rales or wheezing.               Neurological: Pt is alert. At baseline orientation, motor grossly intact               Skin: Skin is warm. No rashes, no other new lesions, LE edema - none               Psychiatric: Pt behavior is normal without agitation   Micro: none  Cardiac tracings I have personally interpreted today:  none  Pertinent Radiological findings (summarize): none   Lab Results  Component Value Date   WBC 8.2 02/29/2024   HGB 11.4 (L) 02/29/2024   HCT 34.2 (L) 02/29/2024   PLT 324.0 02/29/2024   GLUCOSE 93 02/29/2024   CHOL 148 02/29/2024   TRIG 193.0 (H) 02/29/2024   HDL 67.00 02/29/2024   LDLCALC 42 02/29/2024   ALT 11 02/29/2024   AST 17 02/29/2024   NA 136 02/29/2024   K 3.9 02/29/2024   CL 101 02/29/2024   CREATININE 0.73 02/29/2024   BUN 17 02/29/2024   CO2 28 02/29/2024   TSH 1.49 02/29/2024   HGBA1C 5.4 02/29/2024   Assessment/Plan:  [name] is a 48 y.o. White or Caucasian [1] female with  has a past medical  history of Compression fracture of lumbar vertebra (HCC) and Hypertension.  Acute sinusitis Mild to mod, for antibx course, augmentin  875 bid, cough med prn,  to f/u any worsening symptoms or concerns  Essential hypertension BP Readings from Last 3 Encounters:  03/16/24 (!) 128/92  02/29/24 134/80  02/01/24 (!) 162/100   Stable much improved, pt to continue medical treatment norvasc  5 every day, lotensin  20 every day, hct 25 qd   Normocytic anemia Pt to continue increased iron diet, declines f/u lab for now  Followup: Return if symptoms worsen or fail to improve.  Lynwood Rush, MD 03/16/2024 1:55 PM [facility]

## 2024-03-16 NOTE — Assessment & Plan Note (Signed)
 BP Readings from Last 3 Encounters:  03/16/24 (!) 128/92  02/29/24 134/80  02/01/24 (!) 162/100   Stable much improved, pt to continue medical treatment norvasc  5 every day, lotensin  20 every day, hct 25 qd

## 2024-03-16 NOTE — Assessment & Plan Note (Signed)
 Mild to mod, for antibx course, augmentin  875 bid, cough med prn,  to f/u any worsening symptoms or concerns

## 2024-03-16 NOTE — Patient Instructions (Signed)
Please take all new medication as prescribed - the antibiotic, and cough medicine  Please continue all other medications as before, and refills have been done if requested.  Please have the pharmacy call with any other refills you may need.  Please keep your appointments with your specialists as you may have planned      

## 2024-04-17 DIAGNOSIS — M5416 Radiculopathy, lumbar region: Secondary | ICD-10-CM | POA: Diagnosis not present

## 2024-04-17 DIAGNOSIS — Z79899 Other long term (current) drug therapy: Secondary | ICD-10-CM | POA: Diagnosis not present

## 2024-04-17 DIAGNOSIS — M961 Postlaminectomy syndrome, not elsewhere classified: Secondary | ICD-10-CM | POA: Diagnosis not present

## 2024-04-17 DIAGNOSIS — M48062 Spinal stenosis, lumbar region with neurogenic claudication: Secondary | ICD-10-CM | POA: Diagnosis not present

## 2024-05-02 DIAGNOSIS — M5416 Radiculopathy, lumbar region: Secondary | ICD-10-CM | POA: Diagnosis not present

## 2024-06-04 DIAGNOSIS — M5416 Radiculopathy, lumbar region: Secondary | ICD-10-CM | POA: Diagnosis not present

## 2024-06-04 DIAGNOSIS — Z79899 Other long term (current) drug therapy: Secondary | ICD-10-CM | POA: Diagnosis not present

## 2024-06-20 DIAGNOSIS — M5416 Radiculopathy, lumbar region: Secondary | ICD-10-CM | POA: Diagnosis not present
